# Patient Record
Sex: Male | Born: 1965 | Race: White | Hispanic: Yes | Marital: Married | Smoking: Never smoker
Health system: Southern US, Community
[De-identification: ages and names within clinical notes are randomized; demographics above are authoritative.]

## PROBLEM LIST (undated history)

## (undated) DIAGNOSIS — K2970 Gastritis, unspecified, without bleeding: Secondary | ICD-10-CM

## (undated) DIAGNOSIS — G4730 Sleep apnea, unspecified: Secondary | ICD-10-CM

## (undated) DIAGNOSIS — K219 Gastro-esophageal reflux disease without esophagitis: Secondary | ICD-10-CM

## (undated) HISTORY — DX: Gastro-esophageal reflux disease without esophagitis: K21.9

## (undated) HISTORY — DX: Gastritis, unspecified, without bleeding: K29.70

## (undated) HISTORY — DX: Sleep apnea, unspecified: G47.30

---

## 2014-07-24 DIAGNOSIS — R42 Dizziness and giddiness: Secondary | ICD-10-CM | POA: Insufficient documentation

## 2014-10-12 ENCOUNTER — Encounter (HOSPITAL_COMMUNITY): Payer: Self-pay | Admitting: Emergency Medicine

## 2014-10-12 ENCOUNTER — Emergency Department (HOSPITAL_COMMUNITY)
Admission: EM | Admit: 2014-10-12 | Discharge: 2014-10-12 | Disposition: A | Discharge status: Home / Self Care | Payer: BC Managed Care – PPO | Attending: Emergency Medicine | Admitting: Emergency Medicine

## 2014-10-12 ENCOUNTER — Emergency Department (HOSPITAL_COMMUNITY): Payer: BC Managed Care – PPO

## 2014-10-12 DIAGNOSIS — Z79899 Other long term (current) drug therapy: Secondary | ICD-10-CM | POA: Diagnosis not present

## 2014-10-12 DIAGNOSIS — R1084 Generalized abdominal pain: Secondary | ICD-10-CM | POA: Diagnosis present

## 2014-10-12 DIAGNOSIS — K5900 Constipation, unspecified: Secondary | ICD-10-CM | POA: Diagnosis not present

## 2014-10-12 MED ORDER — ONDANSETRON 4 MG PO TBDP
4.0000 mg | ORAL_TABLET | Freq: Once | ORAL | Status: AC
  Administered 2014-10-12: 4 mg via ORAL
  Filled 2014-10-12: qty 1

## 2014-10-12 MED ORDER — POLYETHYLENE GLYCOL 3350 17 GM/SCOOP PO POWD
17.0000 g | Freq: Two times a day (BID) | ORAL | Status: DC
Start: 2014-10-12 — End: 2015-02-07

## 2014-10-12 MED ORDER — MAGNESIUM CITRATE PO SOLN
1.0000 | Freq: Once | ORAL | Status: AC
  Administered 2014-10-12: 1 via ORAL
  Filled 2014-10-12: qty 296

## 2014-10-12 MED ORDER — GLYCERIN (LAXATIVE) 2 G RE SUPP: 1.0000 | Freq: Once | RECTAL | Status: DC

## 2014-10-12 MED ORDER — ONDANSETRON 4 MG PO TBDP: 4.0000 mg | ORAL_TABLET | Freq: Three times a day (TID) | ORAL | Status: DC | PRN

## 2014-10-12 MED ORDER — MILK AND MOLASSES ENEMA
1.0000 | Freq: Once | RECTAL | Status: AC
  Administered 2014-10-12: 250 mL via RECTAL
  Filled 2014-10-12: qty 250

## 2014-10-12 NOTE — ED Notes (Signed)
Pt is sipping on a ginger ale per Lorelle FormosaAmie, Charity fundraiserN.

## 2014-10-12 NOTE — Discharge Instructions (Signed)
Please read and follow all provided instructions.  Your diagnoses today include:  1. Constipation     Tests performed today include:  X-ray of your abdomen that shows a large amount of stool in your abdomen  Vital signs. See below for your results today.   Medications prescribed:   Miralax - laxative  This medication can be found over-the-counter.    Glycerin suppository - medication to help with bowel movement   Zofran (ondansetron) - for nausea and vomiting  Take any medications only as directed on prescription or on packaging.   Home care instructions:  Follow any educational materials contained in this packet.  Follow-up instructions: Please follow-up with your primary care provider in the next week for further evaluation of your symptoms.   Return instructions:   Please return to the Emergency Department if you experience worsening symptoms.   Please return if you have worsening abdominal pain, swelling of your abdomen, persistent vomiting, blood in your stool or vomit, or fever.   Please return if you have any other emergent concerns.  Additional Information:  Your vital signs today were: BP 119/55 mmHg   Pulse 74   Temp(Src) 97.9 F (36.6 C) (Oral)   Resp 16   SpO2 97% If your blood pressure (BP) was elevated above 135/85 this visit, please have this repeated by your doctor within one month. --------------

## 2014-10-12 NOTE — ED Notes (Signed)
Pt states he feels "a bit better" after milk and molasses enema.

## 2014-10-12 NOTE — ED Notes (Signed)
Pt is c/o constipation, bloating, and lots of gas  These sxs started around lunch  Pt has taken gas x and maalox without relief  Pt is c/o pain on his left side   Pt states he started having vomiting about 2 hrs ago

## 2014-10-12 NOTE — ED Provider Notes (Signed)
CSN: [id number]     Arrival date & time 10/12/14  0003 History   First MD Initiated Contact with Patient 10/12/14 0025     Chief Complaint  Patient presents with  . Abdominal Pain     (Consider location/radiation/quality/duration/timing/severity/associated sxs/prior Treatment) HPI Comments: Patient states he is constipated.  He hasn't had a good bowel movement in several days.  This afternoon after lunch.  She noticed abdominal distention and intermittent cramping , more in the left mid quadrant.  He states he has nauseated belching and passing flatus.  Denies any abdominal surgery, trauma.  He states he takes started taking an over-the-counter diet pill several days ago.  Denies any dysuria, frequency, fevers, recent illnesses, change in diet.  Patient is a 48 y.o. male presenting with abdominal pain. The history is provided by the patient.  Abdominal Pain Pain location:  Generalized Pain quality: cramping   Pain radiates to:  Does not radiate Pain severity:  Moderate Onset quality:  Gradual Duration:  10 hours Timing:  Intermittent Progression:  Worsening Chronicity:  New Context: diet changes   Context: not laxative use, not recent travel, not suspicious food intake and not trauma   Relieved by:  None tried Worsened by:  Nothing tried Ineffective treatments:  None tried Associated symptoms: flatus and nausea   Associated symptoms: no dysuria and no fever   Risk factors: obesity     History reviewed. No pertinent past medical history. History reviewed. No pertinent past surgical history. Family History  Problem Relation Age of Onset  . Hypertension Mother   . Hypertension Other   . Diabetes Other   . Cancer Other    History  Substance Use Topics  . Smoking status: Never Smoker   . Smokeless tobacco: Not on file  . Alcohol Use: Yes     Comment: social    Review of Systems  Constitutional: Negative for fever.  Gastrointestinal: Positive for nausea, abdominal pain  and flatus.  Genitourinary: Negative for dysuria and frequency.  Neurological: Negative for dizziness.  All other systems reviewed and are negative.     Allergies  Review of patient's allergies indicates no known allergies.  Home Medications   Prior to Admission medications   Medication Sig Start Date End Date Taking? Authorizing Provider  alum & mag hydroxide-simeth (MAALOX PLUS) 400-400-40 MG/5ML suspension Take 10 mLs by mouth every 6 (six) hours as needed for indigestion.   Yes Historical Provider, MD  Multiple Vitamin (MULTIVITAMIN WITH MINERALS) TABS tablet Take 1 tablet by mouth daily.   Yes Historical Provider, MD  OVER THE COUNTER MEDICATION Take 1 capsule by mouth daily.   Yes Historical Provider, MD  OVER THE COUNTER MEDICATION Take 1 tablet by mouth daily.   Yes Historical Provider, MD  simethicone (MYLICON) 80 MG chewable tablet Chew 160 mg by mouth every 6 (six) hours as needed for flatulence.   Yes Historical Provider, MD  glycerin adult (GLYCERIN ADULT) 2 G SUPP Place 1 suppository rectally once. 10/12/14   Renne CriglerJoshua Geiple, PA-C  ondansetron (ZOFRAN ODT) 4 MG disintegrating tablet Take 1 tablet (4 mg total) by mouth every 8 (eight) hours as needed for nausea or vomiting. 10/12/14   Renne CriglerJoshua Geiple, PA-C  polyethylene glycol powder (GLYCOLAX/MIRALAX) powder Take 17 g by mouth 2 (two) times daily. Use twice a day until you are having soft stools 10/12/14   Renne CriglerJoshua Geiple, PA-C   BP 119/70 mmHg  Pulse 78  Temp(Src) 98.2 F (36.8 C) (Oral)  Resp 16  SpO2 97% Physical Exam  ED Course  Procedures (including critical care time) Labs Review Labs Reviewed - No data to display  Imaging Review Dg Abd Acute W/chest  10/12/2014   CLINICAL DATA:  Constipation for 48 hr. Vomiting for a few hr. Left-sided abdominal pain radiating to the umbilical area.  EXAM: ACUTE ABDOMEN SERIES (ABDOMEN 2 VIEW & CHEST 1 VIEW)  COMPARISON:  None.  FINDINGS: Shallow inspiration with linear  atelectasis in the lung bases. Normal heart size and pulmonary vascularity. No focal airspace disease or consolidation in the lungs. No blunting of costophrenic angles. No pneumothorax. Mediastinal contours appear intact.  Stool-filled colon. No small or large bowel distention. No free intra-abdominal air. No abnormal air-fluid levels. No radiopaque stones. Visualized bones appear intact.  IMPRESSION: No evidence of active pulmonary disease. Atelectasis in the lung bases. Nonobstructive bowel gas pattern with stool-filled colon.   Electronically Signed   By: Burman NievesWilliam  Stevens M.D.   On: 10/12/2014 01:10     EKG Interpretation None      MDM   Final diagnoses:  Constipation         Arman FilterGail K Demitrious Mccannon, NP 10/12/14 2020  Olivia Mackielga M Otter, MD 10/13/14 1149

## 2014-10-12 NOTE — ED Provider Notes (Signed)
6:46 AM Handoff from Manus RuddSchulz NP at shift change. Patient with constipation for several days, pain and discomfort worse last night, developed vomiting as well. No fever. Patient states good hydration, exercise. He did start taking Lipozene for weight loss 3 days ago and thinks this may have caused his symptoms. Most recently received Milk of Molasses enema in ED with good results. States that discomfort is not gone, however it is improved from arrival. Currently undergoing PO trial. If he does well, will d/c to home with miralax and glycerin suppository. He has received mag citrate in ED as well.   Exam:  Gen NAD; Heart RRR, nml S1,S2, no m/r/g; Lungs CTAB; Abd soft, mild generalized tenderness, no rebound or guarding; Ext 2+ pedal pulses bilaterally, no edema.  7:20 AM Patient tolerated 1/2 can soda and walked the halls. He still complains of pain -- no longer in left side of abdomen but more central. Abdomen is typanitic. VSS. I asked patient and partner if he is comfortable with going home and he is. I encouraged treatments as prescribed. He can use OTC enema if desired.   I discussed return instructions with patient and partner. The patient was urged to return to the Emergency Department immediately with worsening of current symptoms, worsening abdominal pain, persistent vomiting, blood noted in stools, fever, or any other concerns. The patient verbalized understanding. We discussed that his symptoms are consistent with constipation at this time, but abdominal pain is unpredictable and can evolve over time.   BP 119/55 mmHg  Pulse 74  Temp(Src) 97.9 F (36.6 C) (Oral)  Resp 16  SpO2 97%        Renne CriglerJoshua Jewelianna Pancoast, PA-C 10/12/14 0725  Olivia Mackielga M Otter, MD 10/12/14 [id number]

## 2015-02-07 ENCOUNTER — Encounter (HOSPITAL_COMMUNITY): Payer: Self-pay | Admitting: Family Medicine

## 2015-02-07 ENCOUNTER — Emergency Department (HOSPITAL_COMMUNITY)
Admission: EM | Admit: 2015-02-07 | Discharge: 2015-02-07 | Disposition: A | Discharge status: Home / Self Care | Payer: BC Managed Care – PPO | Source: Home / Self Care | Attending: Family Medicine | Admitting: Family Medicine

## 2015-02-07 DIAGNOSIS — T148 Other injury of unspecified body region: Secondary | ICD-10-CM | POA: Diagnosis not present

## 2015-02-07 DIAGNOSIS — T148XXA Other injury of unspecified body region, initial encounter: Secondary | ICD-10-CM

## 2015-02-07 DIAGNOSIS — B958 Unspecified staphylococcus as the cause of diseases classified elsewhere: Secondary | ICD-10-CM

## 2015-02-07 MED ORDER — SULFAMETHOXAZOLE-TRIMETHOPRIM 800-160 MG PO TABS: 1.0000 | ORAL_TABLET | Freq: Two times a day (BID) | ORAL | Status: DC

## 2015-02-07 MED ORDER — SULFAMETHOXAZOLE-TRIMETHOPRIM 800-160 MG PO TABS
1.0000 | ORAL_TABLET | Freq: Two times a day (BID) | ORAL | Status: DC
Start: 2015-02-07 — End: 2015-02-07

## 2015-02-07 NOTE — ED Notes (Signed)
Infection to right forearm.  , onset second week in February.  Patient has had clindamycin/doxycycline/antibiotic ointment.

## 2015-02-07 NOTE — ED Provider Notes (Signed)
CSN: [id number]     Arrival date & time 02/07/15  1923 History   First MD Initiated Contact with Patient 02/07/15 1952     No chief complaint on file.  (Consider location/radiation/quality/duration/timing/severity/associated sxs/prior Treatment) HPI  R arm infection. Started 4 weeks ago. Started as a small red raised lesion. 2 different visits to UC in [place] and was given clindamycin on 2/28 and doxy on 3/5 and mupirocin ointment. W/o improvement. Ttp. Feeling tired but denies fevers, nausea, vomiting, CP, SOB, sycnope. Stopped ABX 4 days ago. In last day progression of skin irritation. cdntral purulence noted.    History reviewed. No pertinent past medical history. History reviewed. No pertinent past surgical history. Family History  Problem Relation Age of Onset  . Hypertension Mother   . Hypertension Other   . Diabetes Other   . Cancer Other    History  Substance Use Topics  . Smoking status: Never Smoker   . Smokeless tobacco: Not on file  . Alcohol Use: Yes     Comment: social    Review of Systems Per HPI with all other pertinent systems negative.   Allergies  Review of patient's allergies indicates no known allergies.  Home Medications   Prior to Admission medications   Medication Sig Start Date End Date Taking? Authorizing Provider  alum & mag hydroxide-simeth (MAALOX PLUS) 400-400-40 MG/5ML suspension Take 10 mLs by mouth every 6 (six) hours as needed for indigestion.    Historical Provider, MD  Multiple Vitamin (MULTIVITAMIN WITH MINERALS) TABS tablet Take 1 tablet by mouth daily.    Historical Provider, MD  OVER THE COUNTER MEDICATION Take 1 capsule by mouth daily.    Historical Provider, MD  OVER THE COUNTER MEDICATION Take 1 tablet by mouth daily.    Historical Provider, MD  simethicone (MYLICON) 80 MG chewable tablet Chew 160 mg by mouth every 6 (six) hours as needed for flatulence.    Historical Provider, MD  sulfamethoxazole-trimethoprim (BACTRIM DS,SEPTRA  DS) 800-160 MG per tablet Take 1 tablet by mouth 2 (two) times daily. 02/07/15   Ozella Rocksavid J Merrell, MD   BP 149/91 mmHg  Pulse 68  Temp(Src) 97.3 F (36.3 C) (Oral)  Resp 18  SpO2 95% Physical Exam Physical Exam  Constitutional: oriented to person, place, and time. appears well-developed and well-nourished. No distress.  HENT:  Head: Normocephalic and atraumatic.  Eyes: EOMI. PERRL.  Neck: Normal range of motion.  Cardiovascular: RRR, no m/r/g, 2+ distal pulses,  Pulmonary/Chest: Effort normal and breath sounds normal. No respiratory distress.  Abdominal: Soft. Bowel sounds are normal. NonTTP, no distension.  Musculoskeletal: Normal range of motion. Non ttp, no effusion.  Neurological: alert and oriented to person, place, and time.  Skin: minimal tenderness to palpation. Healthy granulation tissue apparent around area of central wound with ulceration. Surrounding area erythematous in the distribution of adhesive bandages which have been applied copiously over the last several days. No significant induration and no fluctuance. Psychiatric: normal mood and affect. behavior is normal. Judgment and thought content normal.       ED Course  Procedures (including critical care time) Labs Review Labs Reviewed - No data to display  Imaging Review No results found.   MDM   1. Staph infection   2. Open wound    Adhesive skin irritation with surrounding granulation tissue around central ulceration with poor wound healing. This is likely secondary to a significant staff and MRSA infection that is actually healing at this time. Expectations were put  in check and patient realizes that this may take several weeks in order for the wound to close and to fully start to heal. It is concerning that the patient has been on Doxy and Clinda and continues to have what is felt like purulent discharge and pain. There is no sign of crepitus or significant deep tissue infection today. Will change antibiotic  to Bactrim and send another wound culture. Patient's spouse is to call the previous urgent care and get the previous culture and sensitivities. Patient is to start doing wet-to-dry bandages changes every day and will follow up in our clinic with Dr. Clementeen Graham in one week. Patient is to go to the emergency room if develops any worsening symptoms.   Precautions given and all questions answered  [name] Flatten, MD Family Medicine 02/07/2015, 8:34 PM     Ozella Rocks, MD 02/07/15 2034

## 2015-02-07 NOTE — Discharge Instructions (Signed)
You had developed a open wound from a skin infection that will likely take several days to weeks to fully heal. The skin surrounding the wound is healthy appearing. Please start the Bactrim antibiotic. This will help with any staph or MRSA infection. Please continue wet to dry bandage changes, this means clean the wound daily with warm soapy water and allow clean water to rinse over the wound then pat the wound dry and apply a sterile bandage. Apply a small amount of sterile water over the wound region of the bandage allow this to dry over the course of the day and remove the bandage the following morning. Do this until there is no further tissue or discharge noted on the bandage. Please return as discussed next week to meet with Dr. Clementeen GrahamEvan Corey. Please call the previous urgent care and ask for the culture and sensitivities from the previous swab. Please go to the emergency room if her condition significantly worsens. Please use a daily probiotic to help with any abdominal discomfort or bacterial imbalance caused by the antibiotic.

## 2015-02-08 LAB — POCT PREGNANCY, URINE: PREG TEST UR: NEGATIVE

## 2015-02-10 LAB — WOUND CULTURE
Culture: NO GROWTH
Gram Stain: NONE SEEN

## 2015-02-13 ENCOUNTER — Telehealth (HOSPITAL_COMMUNITY): Payer: Self-pay | Admitting: Family Medicine

## 2015-02-13 NOTE — ED Notes (Signed)
Called and spoke to patient and spouse. Patient has been applying wet-to-dry bandages everyday. Today is the first day that there is no serous drainage on the bandage. Feels that the wound is significantly improved and nearly resolved. Continues to take Bactrim without complication. Of note they have not called the old urgent care yet to get culture and sensitivity reports. They will do so today. Advised patient to continue wet-to-dry bandages for 1 more day to ensure that wound is completely closed. Complete antibiotic course. They're to come in for further evaluation and management if wound has not completely closed and/or symptoms return. Discussed prolonged course of wound and scar healing due to significance of staph infection and ulceration of the skin.  [name] [name], MD Family Medicine 02/13/2015, 12:32 PM    Benjamin Rocksavid J Edelin Fryer, MD 02/13/15 [phone]

## 2015-06-14 ENCOUNTER — Encounter: Payer: Self-pay | Admitting: Family Medicine

## 2015-06-14 ENCOUNTER — Ambulatory Visit (INDEPENDENT_AMBULATORY_CARE_PROVIDER_SITE_OTHER): Payer: BC Managed Care – PPO | Admitting: Family Medicine

## 2015-06-14 VITALS — BP 133/83 | HR 64 | Ht 70.0 in | Wt 239.0 lb

## 2015-06-14 DIAGNOSIS — K2970 Gastritis, unspecified, without bleeding: Secondary | ICD-10-CM | POA: Diagnosis not present

## 2015-06-14 DIAGNOSIS — E669 Obesity, unspecified: Secondary | ICD-10-CM | POA: Diagnosis not present

## 2015-06-14 DIAGNOSIS — K219 Gastro-esophageal reflux disease without esophagitis: Secondary | ICD-10-CM | POA: Insufficient documentation

## 2015-06-14 DIAGNOSIS — R0683 Snoring: Secondary | ICD-10-CM | POA: Diagnosis not present

## 2015-06-14 HISTORY — DX: Gastro-esophageal reflux disease without esophagitis: K21.9

## 2015-06-14 HISTORY — DX: Gastritis, unspecified, without bleeding: K29.70

## 2015-06-14 LAB — COMPLETE METABOLIC PANEL WITH GFR
ALT: 36 U/L (ref 0–53)
AST: 24 U/L (ref 0–37)
Albumin: 4.5 g/dL (ref 3.5–5.2)
Alkaline Phosphatase: 68 U/L (ref 39–117)
BUN: 17 mg/dL (ref 6–23)
CHLORIDE: 100 meq/L (ref 96–112)
CO2: 29 mEq/L (ref 19–32)
Calcium: 9.4 mg/dL (ref 8.4–10.5)
Creat: 0.81 mg/dL (ref 0.50–1.35)
GFR, Est African American: >89 mL/min
GLUCOSE: 87 mg/dL (ref 70–99)
Potassium: 4.3 mEq/L (ref 3.5–5.3)
SODIUM: 141 meq/L (ref 135–145)
Total Bilirubin: 0.7 mg/dL (ref 0.2–1.2)
Total Protein: 7.2 g/dL (ref 6.0–8.3)

## 2015-06-14 LAB — CBC
HCT: 46.9 % (ref 39.0–52.0)
Hemoglobin: 15.5 g/dL (ref 13.0–17.0)
MCH: 29.2 pg (ref 26.0–34.0)
MCHC: 33 g/dL (ref 30.0–36.0)
MCV: 88.3 fL (ref 78.0–100.0)
MPV: 9.9 fL (ref 8.6–12.4)
Platelets: 251 10*3/uL (ref 150–400)
RBC: 5.31 MIL/uL (ref 4.22–5.81)
RDW: 14.1 % (ref 11.5–15.5)
WBC: 6.3 10*3/uL (ref 4.0–10.5)

## 2015-06-14 LAB — LDL CHOLESTEROL, DIRECT: Direct LDL: 116 mg/dL — ABNORMAL HIGH

## 2015-06-14 MED ORDER — OMEPRAZOLE 40 MG PO CPDR: 40.0000 mg | DELAYED_RELEASE_CAPSULE | Freq: Every day | ORAL | Status: DC

## 2015-06-14 MED ORDER — SUCRALFATE 1 G PO TABS: 1.0000 g | ORAL_TABLET | Freq: Three times a day (TID) | ORAL | Status: DC

## 2015-06-14 NOTE — Assessment & Plan Note (Signed)
Trial of omeprazole and Carafate if not better refer to gastroenterology

## 2015-06-14 NOTE — Patient Instructions (Addendum)
Thank you for coming in today. Go for sleep study.  Call me if you never hear from them.  Take omeprazole daily.  Use carafte for 1-2 weeks.  Gastroesophageal Reflux Disease, Adult Gastroesophageal reflux disease (GERD) happens when acid from your stomach flows up into the esophagus. When acid comes in contact with the esophagus, the acid causes soreness (inflammation) in the esophagus. Over time, GERD may create small holes (ulcers) in the lining of the esophagus. CAUSES   Increased body weight. This puts pressure on the stomach, making acid rise from the stomach into the esophagus.  Smoking. This increases acid production in the stomach.  Drinking alcohol. This causes decreased pressure in the lower esophageal sphincter (valve or ring of muscle between the esophagus and stomach), allowing acid from the stomach into the esophagus.  Late evening meals and a full stomach. This increases pressure and acid production in the stomach.  A malformed lower esophageal sphincter. Sometimes, no cause is found. SYMPTOMS   Burning pain in the lower part of the mid-chest behind the breastbone and in the mid-stomach area. This may occur twice a week or more often.  Trouble swallowing.  Sore throat.  Dry cough.  Asthma-like symptoms including chest tightness, shortness of breath, or wheezing. DIAGNOSIS  Your caregiver may be able to diagnose GERD based on your symptoms. In some cases, X-rays and other tests may be done to check for complications or to check the condition of your stomach and esophagus. TREATMENT  Your caregiver may recommend over-the-counter or prescription medicines to help decrease acid production. Ask your caregiver before starting or adding any new medicines.  HOME CARE INSTRUCTIONS   Change the factors that you can control. Ask your caregiver for guidance concerning weight loss, quitting smoking, and alcohol consumption.  Avoid foods and drinks that make your symptoms  worse, such as:  Caffeine or alcoholic drinks.  Chocolate.  Peppermint or mint flavorings.  Garlic and onions.  Spicy foods.  Citrus fruits, such as oranges, lemons, or limes.  Tomato-based foods such as sauce, chili, salsa, and pizza.  Fried and fatty foods.  Avoid lying down for the 3 hours prior to your bedtime or prior to taking a nap.  Eat small, frequent meals instead of large meals.  Wear loose-fitting clothing. Do not wear anything tight around your waist that causes pressure on your stomach.  Raise the head of your bed 6 to 8 inches with wood blocks to help you sleep. Extra pillows will not help.  Only take over-the-counter or prescription medicines for pain, discomfort, or fever as directed by your caregiver.  Do not take aspirin, ibuprofen, or other nonsteroidal anti-inflammatory drugs (NSAIDs). SEEK IMMEDIATE MEDICAL CARE IF:   You have pain in your arms, neck, jaw, teeth, or back.  Your pain increases or changes in intensity or duration.  You develop nausea, vomiting, or sweating (diaphoresis).  You develop shortness of breath, or you faint.  Your vomit is green, yellow, black, or looks like coffee grounds or blood.  Your stool is red, bloody, or black. These symptoms could be signs of other problems, such as heart disease, gastric bleeding, or esophageal bleeding. MAKE SURE YOU:   Understand these instructions.  Will watch your condition.  Will get help right away if you are not doing well or get worse. Document Released: 08/19/2005 Document Revised: 02/01/2012 Document Reviewed: 05/29/2011 [facility] Patient Information 2015 [place], [place]. This information is not intended to replace advice given to you by your health care  provider. Make sure you discuss any questions you have with your health care provider.

## 2015-06-14 NOTE — Assessment & Plan Note (Signed)
Sleep study

## 2015-06-14 NOTE — Progress Notes (Signed)
Benjamin Singleton is a 49 y.o. male who presents to [facility]  today for establish care. Patient notes left upper quadrant abdominal pain and stomach irritation. This is consistent with previous episodes of acute gastritis. He does not drink alcohol with any degree or frequency. He takes Excedrin migraine occasionally for headache. He's been using a lot of this recently for headache that has since resolved. He uses Zantac which helps some. He denies any fevers chills or vomiting or current diarrhea. In the past he has had a history of H. pylori that was successfully treated.  Additionally patient notes that he snores and wakes up gasping for breath frequently. His husband thinks that he has sleep apnea.   History reviewed. No pertinent past medical history. History reviewed. No pertinent past surgical history. History  Substance Use Topics  . Smoking status: Never Smoker   . Smokeless tobacco: Not on file  . Alcohol Use: Yes     Comment: social   ROS as above Medications: Current Outpatient Prescriptions  Medication Sig Dispense Refill  . alum & mag hydroxide-simeth (MAALOX PLUS) 400-400-40 MG/5ML suspension Take 10 mLs by mouth every 6 (six) hours as needed for indigestion.    . ASTRAGALUS PO Take by mouth.    . Cyanocobalamin (B-12 PO) Take by mouth.    Chilton Si Tea, Camillia sinensis, (GREEN TEA PO) Take by mouth.    . Loratadine (CLARITIN) 10 MG CAPS Take by mouth.    . Multiple Vitamin (MULTIVITAMIN WITH MINERALS) TABS tablet Take 1 tablet by mouth daily.    . Multiple Vitamins-Minerals (ZINC PO) Take by mouth.    . Omega-3 Fatty Acids (FISH OIL PO) Take by mouth.    Marland Kitchen OVER THE COUNTER MEDICATION Take 1 capsule by mouth daily.    Marland Kitchen OVER THE COUNTER MEDICATION Take 1 tablet by mouth daily.    . peppermint oil liquid by Does not apply route as needed.    . Pyridoxine HCl (B-6 PO) Take by mouth.    . ranitidine (ZANTAC) 150 MG capsule Take 150 mg by  mouth 2 (two) times daily.    . simethicone (MYLICON) 80 MG chewable tablet Chew 160 mg by mouth every 6 (six) hours as needed for flatulence.    Marland Kitchen Specialty Vitamins Products (MAGNESIUM, AMINO ACID CHELATE,) 133 MG tablet Take 1 tablet by mouth 2 (two) times daily.    Marland Kitchen omeprazole (PRILOSEC) 40 MG capsule Take 1 capsule (40 mg total) by mouth daily. 90 capsule 3  . sucralfate (CARAFATE) 1 G tablet Take 1 tablet (1 g total) by mouth 4 (four) times daily -  with meals and at bedtime. 60 tablet 0   No current facility-administered medications for this visit.   No Known Allergies   Exam:  BP 133/83 mmHg  Pulse 64  Ht  (1.778 m)  Wt 239 lb (108.41 kg)  BMI 34.29 kg/m2 Gen: Well NAD HEENT: EOMI,  MMM large neck Lungs: Normal work of breathing. CTABL Heart: RRR no MRG Abd: NABS, Soft. Nondistended, Nontender Exts: Brisk capillary refill, warm and well perfused.   No results found for this or any previous visit (from the past 24 hour(s)). No results found.   Please see individual assessment and plan sections.

## 2015-06-16 NOTE — Progress Notes (Signed)
Quick Note:  Normal, no changes. ______ 

## 2015-09-10 ENCOUNTER — Ambulatory Visit (HOSPITAL_BASED_OUTPATIENT_CLINIC_OR_DEPARTMENT_OTHER): Payer: BC Managed Care – PPO | Attending: Family Medicine

## 2015-12-09 ENCOUNTER — Encounter (HOSPITAL_COMMUNITY): Payer: Self-pay | Admitting: Emergency Medicine

## 2015-12-09 ENCOUNTER — Emergency Department (HOSPITAL_COMMUNITY)
Admission: EM | Admit: 2015-12-09 | Discharge: 2015-12-09 | Disposition: A | Discharge status: Home / Self Care | Payer: BC Managed Care – PPO | Attending: Emergency Medicine | Admitting: Emergency Medicine

## 2015-12-09 ENCOUNTER — Telehealth: Payer: Self-pay | Admitting: *Deleted

## 2015-12-09 DIAGNOSIS — K5900 Constipation, unspecified: Secondary | ICD-10-CM | POA: Insufficient documentation

## 2015-12-09 DIAGNOSIS — R1013 Epigastric pain: Secondary | ICD-10-CM

## 2015-12-09 DIAGNOSIS — Z79899 Other long term (current) drug therapy: Secondary | ICD-10-CM | POA: Insufficient documentation

## 2015-12-09 DIAGNOSIS — R110 Nausea: Secondary | ICD-10-CM | POA: Diagnosis not present

## 2015-12-09 DIAGNOSIS — R109 Unspecified abdominal pain: Secondary | ICD-10-CM | POA: Diagnosis present

## 2015-12-09 LAB — COMPREHENSIVE METABOLIC PANEL
ALBUMIN: 4.1 g/dL (ref 3.5–5.0)
ALK PHOS: 62 U/L (ref 38–126)
ALT: 36 U/L (ref 17–63)
ANION GAP: 8 (ref 5–15)
AST: 28 U/L (ref 15–41)
BUN: 10 mg/dL (ref 6–20)
CO2: 28 mmol/L (ref 22–32)
Calcium: 9 mg/dL (ref 8.9–10.3)
Chloride: 101 mmol/L (ref 101–111)
Creatinine, Ser: 0.94 mg/dL (ref 0.61–1.24)
GFR calc Af Amer: >60 mL/min (ref >60)
GFR calc non Af Amer: >60 mL/min (ref >60)
Glucose, Bld: 118 mg/dL — ABNORMAL HIGH (ref 65–99)
Potassium: 4.3 mmol/L (ref 3.5–5.1)
SODIUM: 137 mmol/L (ref 135–145)
Total Bilirubin: 0.6 mg/dL (ref 0.3–1.2)
Total Protein: 7 g/dL (ref 6.5–8.1)

## 2015-12-09 LAB — CBC
HEMATOCRIT: 45.1 % (ref 39.0–52.0)
HEMOGLOBIN: 15 g/dL (ref 13.0–17.0)
MCH: 29.6 pg (ref 26.0–34.0)
MCHC: 33.3 g/dL (ref 30.0–36.0)
MCV: 89 fL (ref 78.0–100.0)
Platelets: 233 10*3/uL (ref 150–400)
RBC: 5.07 MIL/uL (ref 4.22–5.81)
RDW: 13.4 % (ref 11.5–15.5)
WBC: 11.6 10*3/uL — ABNORMAL HIGH (ref 4.0–10.5)

## 2015-12-09 LAB — LIPASE, BLOOD: Lipase: 45 U/L (ref 11–51)

## 2015-12-09 LAB — I-STAT TROPONIN, ED: TROPONIN I, POC: 0 ng/mL (ref 0.00–0.08)

## 2015-12-09 LAB — URINALYSIS, ROUTINE W REFLEX MICROSCOPIC
BILIRUBIN URINE: NEGATIVE
GLUCOSE, UA: NEGATIVE mg/dL
Hgb urine dipstick: NEGATIVE
Ketones, ur: NEGATIVE mg/dL
Leukocytes, UA: NEGATIVE
Nitrite: NEGATIVE
PH: 7.5 (ref 5.0–8.0)
Protein, ur: NEGATIVE mg/dL
SPECIFIC GRAVITY, URINE: 1.019 (ref 1.005–1.030)

## 2015-12-09 MED ORDER — MILK AND MOLASSES ENEMA
1.0000 | Freq: Once | RECTAL | Status: AC
  Administered 2015-12-09: 250 mL via RECTAL
  Filled 2015-12-09: qty 250

## 2015-12-09 MED ORDER — ONDANSETRON 4 MG PO TBDP
4.0000 mg | ORAL_TABLET | Freq: Once | ORAL | Status: AC
  Administered 2015-12-09: 4 mg via ORAL
  Filled 2015-12-09: qty 1

## 2015-12-09 MED ORDER — GI COCKTAIL ~~LOC~~
30.0000 mL | Freq: Once | ORAL | Status: AC
  Administered 2015-12-09: 30 mL via ORAL
  Filled 2015-12-09: qty 30

## 2015-12-09 MED ORDER — ALUM & MAG HYDROXIDE-SIMETH 200-200-20 MG/5ML PO SUSP: 30.0000 mL | Freq: Once | ORAL | Status: DC

## 2015-12-09 MED ORDER — DOCUSATE SODIUM 100 MG PO CAPS: 100.0000 mg | ORAL_CAPSULE | Freq: Two times a day (BID) | ORAL | Status: DC

## 2015-12-09 MED ORDER — OMEPRAZOLE 40 MG PO CPDR: 40.0000 mg | DELAYED_RELEASE_CAPSULE | Freq: Every day | ORAL | Status: DC

## 2015-12-09 MED ORDER — PANTOPRAZOLE SODIUM 40 MG PO TBEC
40.0000 mg | DELAYED_RELEASE_TABLET | Freq: Every day | ORAL | Status: DC
  Administered 2015-12-09: 40 mg via ORAL
  Filled 2015-12-09: qty 1

## 2015-12-09 MED ORDER — SUCRALFATE 1 G PO TABS: 1.0000 g | ORAL_TABLET | Freq: Three times a day (TID) | ORAL | Status: DC

## 2015-12-09 NOTE — ED Notes (Signed)
flexi seal removed-- pt assisted to bedside commode.

## 2015-12-09 NOTE — ED Notes (Signed)
Pt had moderate success from enema, continues to have pain 6/10 epigastric area.

## 2015-12-09 NOTE — ED Notes (Signed)
Pt vomiting in the lobby, given emesis bag, alcohol pads for nose.

## 2015-12-09 NOTE — ED Notes (Signed)
C/o sharp mid upper abd pain x 24 hours with nausea.  Denies vomiting.  States he is constipated.  Last BM 2 hours ago but only a small amount.  Took antiacids, glycerin suppositories, and multiple OTC meds without relief.

## 2015-12-09 NOTE — Telephone Encounter (Signed)
The patient's husband called and stated that the patient is still bloated and in pain.  He was seen in the ED last night. They gave GI cocktail and enema with some relief. Patient continues to have pain.  He has an appointment tomorrow morning with Dr. Denyse Amassorey.  Advised the husband to keep fluids on board and to continue with the maalox, carafate and simethicone, as well as some gatorade.  If the pain gets more intense or changes location, they will head back to the ED.

## 2015-12-09 NOTE — ED Provider Notes (Signed)
CSN: [id number]     Arrival date & time 12/09/15  0256 History   First MD Initiated Contact with Patient 12/09/15 [phone]     Chief Complaint  Patient presents with  . Abdominal Pain     (Consider location/radiation/quality/duration/timing/severity/associated sxs/prior Treatment) HPI Comments: 50yo M w/ h/o PUD and gastritis who p/w abdominal pain. The patient states that for the past 24 hours he has had constant upper abdominal pain associated with nausea and constipation. He has taken multiple over-the-counter medications including antacids, MiraLAX, Mylanta, and glycerin suppositories with only minimal relief of his constipation but no relief of his abdominal pain. This evening in the lobby he began vomiting. He denies any fevers. No significant NSAID or alcohol use. He has a distant history of peptic ulcers and was on a PPI in the past but is not currently.  Patient is a 50 y.o. male presenting with abdominal pain. The history is provided by the patient.  Abdominal Pain   History reviewed. No pertinent past medical history. History reviewed. No pertinent past surgical history. Family History  Problem Relation Age of Onset  . Hypertension Mother   . Hypertension Other   . Diabetes Other   . Cancer Other    Social History  Substance Use Topics  . Smoking status: Never Smoker   . Smokeless tobacco: None  . Alcohol Use: Yes     Comment: social    Review of Systems  Gastrointestinal: Positive for abdominal pain.   10 Systems reviewed and are negative for acute change except as noted in the HPI.    Allergies  Review of patient's allergies indicates no known allergies.  Home Medications   Prior to Admission medications   Medication Sig Start Date End Date Taking? Authorizing Provider  alum & mag hydroxide-simeth (MAALOX PLUS) 400-400-40 MG/5ML suspension Take 10 mLs by mouth every 6 (six) hours as needed for indigestion.    Historical Provider, MD  ASTRAGALUS PO Take by  mouth.    Historical Provider, MD  Cyanocobalamin (B-12 PO) Take by mouth.    Historical Provider, MD  docusate sodium (COLACE) 100 MG capsule Take 1 capsule (100 mg total) by mouth every 12 (twelve) hours. 12/09/15   Laurence Spates, MD  [place] Si Tea, Camillia sinensis, (GREEN TEA PO) Take by mouth.    Historical Provider, MD  Loratadine (CLARITIN) 10 MG CAPS Take by mouth.    Historical Provider, MD  Multiple Vitamin (MULTIVITAMIN WITH MINERALS) TABS tablet Take 1 tablet by mouth daily.    Historical Provider, MD  Multiple Vitamins-Minerals (ZINC PO) Take by mouth.    Historical Provider, MD  Omega-3 Fatty Acids (FISH OIL PO) Take by mouth.    Historical Provider, MD  omeprazole (PRILOSEC) 40 MG capsule Take 1 capsule (40 mg total) by mouth daily. 12/09/15   Laurence Spates, MD  OVER THE COUNTER MEDICATION Take 1 capsule by mouth daily.    Historical Provider, MD  OVER THE COUNTER MEDICATION Take 1 tablet by mouth daily.    Historical Provider, MD  peppermint oil liquid by Does not apply route as needed.    Historical Provider, MD  Pyridoxine HCl (B-6 PO) Take by mouth.    Historical Provider, MD  ranitidine (ZANTAC) 150 MG capsule Take 150 mg by mouth 2 (two) times daily.    Historical Provider, MD  simethicone (MYLICON) 80 MG chewable tablet Chew 160 mg by mouth every 6 (six) hours as needed for flatulence.    Historical Provider, MD  Specialty Vitamins Products (MAGNESIUM, AMINO ACID CHELATE,) 133 MG tablet Take 1 tablet by mouth 2 (two) times daily.    Historical Provider, MD  sucralfate (CARAFATE) 1 g tablet Take 1 tablet (1 g total) by mouth 4 (four) times daily -  with meals and at bedtime. 12/09/15   Ambrose Finlandachel Morgan Little, MD   BP 123/78 mmHg  Pulse 73  Temp(Src) 98.3 F (36.8 C) (Oral)  Resp 15  Ht 5\' 10"  (1.778 m)  Wt 229 lb (103.874 kg)  BMI 32.86 kg/m2  SpO2 95% Physical Exam  Constitutional: He is oriented to person, place, and time. He appears well-developed and  well-nourished. No distress.  Uncomfortable  HENT:  Head: Normocephalic and atraumatic.  Moist mucous membranes  Eyes: Conjunctivae are normal. Pupils are equal, round, and reactive to light.  Neck: Neck supple.  Cardiovascular: Normal rate, regular rhythm and normal heart sounds.   No murmur heard. Pulmonary/Chest: Effort normal and breath sounds normal.  Abdominal: Soft. Bowel sounds are normal. He exhibits no distension. There is no rebound and no guarding.  midepigastric TTP  Musculoskeletal: He exhibits no edema.  Neurological: He is alert and oriented to person, place, and time.  Fluent speech  Skin: Skin is warm and dry.  Psychiatric: He has a normal mood and affect. Judgment normal.  Nursing note and vitals reviewed.   ED Course  Procedures (including critical care time) Labs Review Labs Reviewed  COMPREHENSIVE METABOLIC PANEL - Abnormal; Notable for the following:    Glucose, Bld 118 (*)    All other components within normal limits  CBC - Abnormal; Notable for the following:    WBC 11.6 (*)    All other components within normal limits  URINALYSIS, ROUTINE W REFLEX MICROSCOPIC (NOT AT [facility]) - Abnormal; Notable for the following:    APPearance CLOUDY (*)    All other components within normal limits  LIPASE, BLOOD  I-STAT TROPOININ, ED    Imaging Review No results found. I have personally reviewed and evaluated these lab results as part of my medical decision-making.   EKG Interpretation   Date/Time:  Monday December 09 2015 03:01:32 EST Ventricular Rate:  63 PR Interval:  180 QRS Duration: 150 QT Interval:  448 QTC Calculation: 458 R Axis:   72 Text Interpretation:  Normal sinus rhythm Right bundle branch block  Abnormal ECG No previous ECGs available Confirmed by LITTLE MD, RACHEL  [phone]) on 12/09/2015 4:18:53 AM     Medications  pantoprazole (PROTONIX) EC tablet 40 mg (40 mg Oral Given 12/09/15 0801)  gi cocktail (Maalox,Lidocaine,Donnatal) (30 mLs  Oral Given 12/09/15 [id number])  milk and molasses enema (250 mLs Rectal Given 12/09/15 0709)  ondansetron (ZOFRAN-ODT) disintegrating tablet 4 mg (4 mg Oral Given 12/09/15 0656)    MDM   Final diagnoses:  Epigastric pain  Constipation, unspecified constipation type   Patient presents with 1 day of midepigastric abdominal pain associated with constipation and one episode of vomiting this evening. On exam he was uncomfortable but nontoxic. Vital signs stable. Midepigastric tenderness noted. Gave the patient protonix, GI cocktail, and enema.  Obtained above lab work which was unremarkable and showed normal lipase and LFTs. Pt later vomited, gave zofran.  On reexamination, pt was resting comfortably and tolerating liquids. He stated that overall his constipation and abdominal pain were improved. Given that he has no lower abdominal tenderness and his lab work is reassuring, I feel he is safe for discharge home. Discussed supportive care treatment for constipation and  discussed empiric treatment for gastritis or PUD with omeprazole and Carafate. Emphasized importance of follow-up with PCP for gastroenterology referral. Patient and spouse voiced understanding and patient was discharged in satisfactory condition.    Laurence Spates, MD 12/09/15 [phone]

## 2015-12-10 ENCOUNTER — Ambulatory Visit (INDEPENDENT_AMBULATORY_CARE_PROVIDER_SITE_OTHER): Payer: BC Managed Care – PPO | Admitting: Family Medicine

## 2015-12-10 ENCOUNTER — Encounter (HOSPITAL_BASED_OUTPATIENT_CLINIC_OR_DEPARTMENT_OTHER): Payer: Self-pay

## 2015-12-10 ENCOUNTER — Encounter: Payer: Self-pay | Admitting: Family Medicine

## 2015-12-10 ENCOUNTER — Ambulatory Visit (HOSPITAL_BASED_OUTPATIENT_CLINIC_OR_DEPARTMENT_OTHER)
Admission: RE | Admit: 2015-12-10 | Discharge: 2015-12-10 | Disposition: A | Discharge status: Home / Self Care | Payer: BC Managed Care – PPO | Source: Ambulatory Visit | Attending: Family Medicine | Admitting: Family Medicine

## 2015-12-10 VITALS — BP 124/70 | HR 80 | Wt 243.0 lb

## 2015-12-10 DIAGNOSIS — K2970 Gastritis, unspecified, without bleeding: Secondary | ICD-10-CM

## 2015-12-10 DIAGNOSIS — R109 Unspecified abdominal pain: Secondary | ICD-10-CM | POA: Insufficient documentation

## 2015-12-10 DIAGNOSIS — R140 Abdominal distension (gaseous): Secondary | ICD-10-CM | POA: Diagnosis not present

## 2015-12-10 DIAGNOSIS — K5900 Constipation, unspecified: Secondary | ICD-10-CM | POA: Insufficient documentation

## 2015-12-10 DIAGNOSIS — R1084 Generalized abdominal pain: Secondary | ICD-10-CM | POA: Diagnosis not present

## 2015-12-10 LAB — CBC
HCT: 41.5 % (ref 39.0–52.0)
HEMOGLOBIN: 14.7 g/dL (ref 13.0–17.0)
MCH: 29.6 pg (ref 26.0–34.0)
MCHC: 35.4 g/dL (ref 30.0–36.0)
MCV: 83.5 fL (ref 78.0–100.0)
MPV: 9.9 fL (ref 8.6–12.4)
PLATELETS: 240 10*3/uL (ref 150–400)
RBC: 4.97 MIL/uL (ref 4.22–5.81)
RDW: 13.7 % (ref 11.5–15.5)
WBC: 13.3 10*3/uL — ABNORMAL HIGH (ref 4.0–10.5)

## 2015-12-10 LAB — COMPREHENSIVE METABOLIC PANEL
ALBUMIN: 4.2 g/dL (ref 3.6–5.1)
ALK PHOS: 74 U/L (ref 40–115)
ALT: 30 U/L (ref 9–46)
AST: 22 U/L (ref 10–35)
BILIRUBIN TOTAL: 1.3 mg/dL — AB (ref 0.2–1.2)
BUN: 10 mg/dL (ref 7–25)
CO2: 25 mmol/L (ref 20–31)
CREATININE: 0.83 mg/dL (ref 0.70–1.33)
Calcium: 8.6 mg/dL (ref 8.6–10.3)
Chloride: 97 mmol/L — ABNORMAL LOW (ref 98–110)
Glucose, Bld: 95 mg/dL (ref 65–99)
Potassium: 4 mmol/L (ref 3.5–5.3)
SODIUM: 135 mmol/L (ref 135–146)
TOTAL PROTEIN: 6.5 g/dL (ref 6.1–8.1)

## 2015-12-10 LAB — LIPASE: LIPASE: 47 U/L (ref 7–60)

## 2015-12-10 MED ORDER — POLYETHYLENE GLYCOL 3350 17 GM/SCOOP PO POWD: 17.0000 g | Freq: Every day | ORAL | Status: DC

## 2015-12-10 MED ORDER — ONDANSETRON 4 MG PO TBDP
8.0000 mg | ORAL_TABLET | Freq: Once | ORAL | Status: AC
  Administered 2015-12-10: 8 mg via ORAL

## 2015-12-10 MED ORDER — ONDANSETRON HCL 8 MG PO TABS: 8.0000 mg | ORAL_TABLET | Freq: Three times a day (TID) | ORAL | Status: DC | PRN

## 2015-12-10 MED ORDER — IOHEXOL 300 MG/ML  SOLN
100.0000 mL | Freq: Once | INTRAMUSCULAR | Status: AC | PRN
  Administered 2015-12-10: 100 mL via INTRAVENOUS

## 2015-12-10 MED ORDER — OMEPRAZOLE 40 MG PO CPDR: 40.0000 mg | DELAYED_RELEASE_CAPSULE | Freq: Every day | ORAL | Status: DC

## 2015-12-10 NOTE — Patient Instructions (Addendum)
Thank you for coming in today. You were seen today for your belly pain.  We need to do imaging and bloodwork because we are worried that your GI system might be blocked.  We recommend that you take your omeprazole (Prilosec) every day and Sucralfate as prescribed.  We recommend daily Benefiber and Miralax.  Please take the Zofran as needed for the Please come back on Thursday.   Food Choices for Gastroesophageal Reflux Disease, Adult When you have gastroesophageal reflux disease (GERD), the foods you eat and your eating habits are very important. Choosing the right foods can help ease the discomfort of GERD. WHAT GENERAL GUIDELINES DO I NEED TO FOLLOW?  Choose fruits, vegetables, whole grains, low-fat dairy products, and low-fat meat, fish, and poultry.  Limit fats such as oils, salad dressings, butter, nuts, and avocado.  Keep a food diary to identify foods that cause symptoms.  Avoid foods that cause reflux. These may be different for different people.  Eat frequent small meals instead of three large meals each day.  Eat your meals slowly, in a relaxed setting.  Limit fried foods.  Cook foods using methods other than frying.  Avoid drinking alcohol.  Avoid drinking large amounts of liquids with your meals.  Avoid bending over or lying down until 2-3 hours after eating. WHAT FOODS ARE NOT RECOMMENDED? The following are some foods and drinks that may worsen your symptoms: Vegetables Tomatoes. Tomato juice. Tomato and spaghetti sauce. Chili peppers. Onion and garlic. Horseradish. Fruits Oranges, grapefruit, and lemon (fruit and juice). Meats High-fat meats, fish, and poultry. This includes hot dogs, ribs, ham, sausage, salami, and bacon. Dairy Whole milk and chocolate milk. Sour cream. Cream. Butter. Ice cream. Cream cheese.  Beverages Coffee and tea, with or without caffeine. Carbonated beverages or energy drinks. Condiments Hot sauce. Barbecue sauce.   Sweets/Desserts Chocolate and cocoa. Donuts. Peppermint and spearmint. Fats and Oils High-fat foods, including [place] fries and potato chips. Other Vinegar. Strong spices, such as black pepper, white pepper, red pepper, cayenne, curry powder, cloves, ginger, and chili powder. The items listed above may not be a complete list of foods and beverages to avoid. Contact your dietitian for more information.   This information is not intended to replace advice given to you by your health care provider. Make sure you discuss any questions you have with your health care provider.   Document Released: 11/09/2005 Document Revised: 11/30/2014 Document Reviewed: 09/13/2013 Elsevier Interactive Patient Education [employer]

## 2015-12-10 NOTE — Assessment & Plan Note (Addendum)
Acute, worsening. Given the intermittent and now worsening nature of the pain, as well as how uncomfortable he is this morning, concerns for obstruction of some etiology. Acutely, imaging and repeat labs is warranted Going forward, I think the patient would best benefit from an endoscopy and colonoscopy given long history of gastritis and constipation as well as current health maintenance needs. Will prescribe omeprazole, Miralax and daily fiber. Follow up in 2 days.   Referral to GI. Stat CT scan abdomen and pelvis ordered. Stat CBC CMP and lipase ordered.

## 2015-12-10 NOTE — Progress Notes (Signed)
Quick Note:  CT scan shows inflammation of the first part of the small intestine and near the pancreas. This is likely due to stomach acid problems.   1) Take the omeprazole twice daily. This is an increase from once daily.  2) Stop All supplements that we don't prescribe.  3) Take the carafate 4x daily.  4) Eat a clear liquid diet (broths etc) until we follow up on Thursday.  5) We are awaiting on labs to come back. ______

## 2015-12-10 NOTE — Progress Notes (Signed)
Yardley Lekas is a 50 y.o. male who presents to [facility]: Primary Care today for abdominal pain and constipation.   Patient has a 3 day history of intermittent abdominal pain. Patient says this has gradually worsened, worst at 7-9/10 yesterday but today feeling like 4-5/10. Patient notes this started epigastic but has since become diffuse. Patient notes he is very uncomfortable and bloated, and he feels like he can't get into a comfortable position. Patient was seen in the ER yesterday with negative lipase, CMP and CBC and negative EKG. He vomited twice and had 1 BM after enema, though has not had another since. Patient was also given GI cocktail which made him feel better late yesterday, prompting discharge from ED. Carafate has made him feel better as well. Today he is starting to feel worse. He has not passed gas nor had a BM this AM but was able to eat breakfast. He has not had any more emesis since yesterday.   Patient has a history of GERD, treated for H pylori infection many years ago. He does not take regular medication for this, and his symptoms have been well controlled since he was last seen 8 months ago. He notes he will occasionally feel epigastric pain but will drink chamomile tea, peppermint oil or chocolate drinks to help settle it.    No past medical history on file. No past surgical history on file. Social History  Substance Use Topics  . Smoking status: Never Smoker   . Smokeless tobacco: Not on file  . Alcohol Use: Yes     Comment: social   family history includes Cancer in his other; Diabetes in his other; Hypertension in his mother and other.  ROS as above Medications: Current Outpatient Prescriptions  Medication Sig Dispense Refill  . alum & mag hydroxide-simeth (MAALOX PLUS) 400-400-40 MG/5ML suspension Take 10 mLs by mouth every 6 (six) hours as needed for indigestion.    .  Loratadine (CLARITIN) 10 MG CAPS Take by mouth.    . Multiple Vitamin (MULTIVITAMIN WITH MINERALS) TABS tablet Take 1 tablet by mouth daily.    . Omega-3 Fatty Acids (FISH OIL PO) Take by mouth.    Marland Kitchen omeprazole (PRILOSEC) 40 MG capsule Take 1 capsule (40 mg total) by mouth daily. 30 capsule 3  . simethicone (MYLICON) 80 MG chewable tablet Chew 160 mg by mouth every 6 (six) hours as needed for flatulence.    . sucralfate (CARAFATE) 1 g tablet Take 1 tablet (1 g total) by mouth 4 (four) times daily -  with meals and at bedtime. 56 tablet 0  . ondansetron (ZOFRAN) 8 MG tablet Take 1 tablet (8 mg total) by mouth every 8 (eight) hours as needed for nausea or vomiting. 20 tablet 1  . polyethylene glycol powder (GLYCOLAX/MIRALAX) powder Take 17 g by mouth daily. 850 g 1   No current facility-administered medications for this visit.   No Known Allergies   Exam:  BP 124/70 mmHg  Pulse 80  Wt 243 lb (110.224 kg) Gen: Uncomfortable appearing gentleman fidgeting on exam table  HEENT: EOMI,  MMM Lungs: Normal work of breathing. CTABL no wheezes or crackles  Heart: RRR no MRG Abd: Diminished bowel sounds, diffusely tender to palpation, no rebound or guarding Exts: Brisk capillary refill, warm and well perfused.  Skin: diaphoretic   Patient was given 8 mg of Zofran ODT prior to discharge  No results found for this or any previous visit (from  the past 24 hour(s)). No results found.   Please see individual assessment and plan sections.

## 2015-12-12 ENCOUNTER — Encounter: Payer: Self-pay | Admitting: Family Medicine

## 2015-12-12 ENCOUNTER — Ambulatory Visit (INDEPENDENT_AMBULATORY_CARE_PROVIDER_SITE_OTHER): Payer: BC Managed Care – PPO | Admitting: Family Medicine

## 2015-12-12 VITALS — BP 136/79 | HR 72 | Wt 240.0 lb

## 2015-12-12 DIAGNOSIS — K2970 Gastritis, unspecified, without bleeding: Secondary | ICD-10-CM

## 2015-12-12 DIAGNOSIS — R1084 Generalized abdominal pain: Secondary | ICD-10-CM | POA: Diagnosis not present

## 2015-12-12 LAB — COMPREHENSIVE METABOLIC PANEL
ALK PHOS: 119 U/L — AB (ref 40–115)
ALT: 42 U/L (ref 9–46)
AST: 26 U/L (ref 10–35)
Albumin: 3.9 g/dL (ref 3.6–5.1)
BUN: 11 mg/dL (ref 7–25)
CO2: 28 mmol/L (ref 20–31)
CREATININE: 0.86 mg/dL (ref 0.70–1.33)
Calcium: 9.1 mg/dL (ref 8.6–10.3)
Chloride: 97 mmol/L — ABNORMAL LOW (ref 98–110)
GLUCOSE: 81 mg/dL (ref 65–99)
Potassium: 3.9 mmol/L (ref 3.5–5.3)
SODIUM: 133 mmol/L — AB (ref 135–146)
TOTAL PROTEIN: 6.7 g/dL (ref 6.1–8.1)
Total Bilirubin: 1.3 mg/dL — ABNORMAL HIGH (ref 0.2–1.2)

## 2015-12-12 LAB — LIPASE: LIPASE: 33 U/L (ref 7–60)

## 2015-12-12 LAB — CBC
HEMATOCRIT: 42.7 % (ref 39.0–52.0)
HEMOGLOBIN: 14.3 g/dL (ref 13.0–17.0)
MCH: 29.5 pg (ref 26.0–34.0)
MCHC: 33.5 g/dL (ref 30.0–36.0)
MCV: 88.2 fL (ref 78.0–100.0)
MPV: 10.2 fL (ref 8.6–12.4)
PLATELETS: 282 10*3/uL (ref 150–400)
RBC: 4.84 MIL/uL (ref 4.22–5.81)
RDW: 13.3 % (ref 11.5–15.5)
WBC: 11.1 10*3/uL — AB (ref 4.0–10.5)

## 2015-12-12 NOTE — Assessment & Plan Note (Addendum)
Likely cause of his abdominal pain given imaging and reassuring labs. Because pain is still present and he had slightly elevated bilirubin and WBC 2 days ago, will re check today to ensure return to normal. Readjusted laxatives. Encouraged patient to schedule an appointment with GI for likely endoscopy and colonoscopy. Continue omeprazole BID for 1 week then stop, add a second dose for pain as needed. Stop sucralfate in 1 week. Follow up in 2 weeks or sooner as needed.

## 2015-12-12 NOTE — Progress Notes (Signed)
Benjamin Singleton is a 50 y.o. male who presents to [facility]: Primary Care today for Follow up abdominal pain  Patient is feeling better today. He still has residual mild LUQ pain but this has progressively improved since 2 days ago. His energy is returning and appetite is improving. He has been on clear liquids and taking his medications as prescribed. He had 6-7 loose stools yesterday and 2-3 more this morning and would like to decrease his laxatives. He has yet to schedule a GI appt because he wanted to wait until he was feeling better. He is able to move around much better at home. He denies melena, vomiting, fever.    No past medical history on file. No past surgical history on file. Social History  Substance Use Topics  . Smoking status: Never Smoker   . Smokeless tobacco: Not on file  . Alcohol Use: Yes     Comment: social   family history includes Cancer in his other; Diabetes in his other; Hypertension in his mother and other.  ROS as above Medications: Current Outpatient Prescriptions  Medication Sig Dispense Refill  . alum & mag hydroxide-simeth (MAALOX PLUS) 400-400-40 MG/5ML suspension Take 10 mLs by mouth every 6 (six) hours as needed for indigestion.    . Loratadine (CLARITIN) 10 MG CAPS Take by mouth.    . Multiple Vitamin (MULTIVITAMIN WITH MINERALS) TABS tablet Take 1 tablet by mouth daily.    . Omega-3 Fatty Acids (FISH OIL PO) Take by mouth.    Marland Kitchen omeprazole (PRILOSEC) 40 MG capsule Take 1 capsule (40 mg total) by mouth daily. 30 capsule 3  . ondansetron (ZOFRAN) 8 MG tablet Take 1 tablet (8 mg total) by mouth every 8 (eight) hours as needed for nausea or vomiting. 20 tablet 1  . polyethylene glycol powder (GLYCOLAX/MIRALAX) powder Take 17 g by mouth daily. 850 g 1  . simethicone (MYLICON) 80 MG chewable tablet Chew 160 mg by mouth every 6 (six) hours as needed for flatulence.      . sucralfate (CARAFATE) 1 g tablet Take 1 tablet (1 g total) by mouth 4 (four) times daily -  with meals and at bedtime. 56 tablet 0   No current facility-administered medications for this visit.   No Known Allergies   Exam:  BP 136/79 mmHg  Pulse 72  Wt 240 lb (108.863 kg) Gen: Mildly uncomfortable appearing gentleman in NAD HEENT: EOMI,  MMM Lungs: Normal work of breathing. CTABL no wheezes or crackles  Heart: RRR no MRG Abd: Soft. Nondistended, Active bowel sounds. Mildly tender in the RUQ and LUQ. No rebound or guarding Exts: Brisk capillary refill, warm and well perfused.  Skin: mild diaphoresis on back and forehead, wearing sweatshirt, knit cap and sweatpants  No results found for this or any previous visit (from the past 24 hour(s)). Ct Abdomen Pelvis W Contrast  12/10/2015  CLINICAL DATA:  Abdominal pain, bloating, constipation EXAM: CT ABDOMEN AND PELVIS WITH CONTRAST TECHNIQUE: Multidetector CT imaging of the abdomen and pelvis was performed using the standard protocol following bolus administration of intravenous contrast. CONTRAST:  OMNIPAQUE IOHEXOL 300 MG/ML  SOLN COMPARISON:  None. FINDINGS: Lower chest:  Mild bibasilar atelectasis.  Normal heart size. Hepatobiliary: Normal gallbladder. Diffuse low attenuation of the liver as can be seen with hepatic steatosis. Pancreas: Pancreas enhances normally. No pancreatic mass. Mild peripancreatic inflammatory changes. No surrounding fluid collection. Spleen: Normal. Adrenals/Urinary Tract: Normal adrenal glands. Normal kidneys. No  urolithiasis or obstructive uropathy. Normal bladder. Stomach/Bowel: No bowel dilatation to suggest obstruction. Mild inflammatory changes around the duodenum with mild duodenal wall thickening. No pneumatosis, pneumoperitoneum or portal venous gas. Small amount of right lower quadrant free fluid. Normal appendix. Vascular/Lymphatic: Normal caliber abdominal aorta. No lymphadenopathy. Other: No fluid  collection or hematoma. Musculoskeletal: No acute osseous abnormality. No lytic or sclerotic osseous lesion. Degenerative disc disease at L5-S1 with bilateral facet arthropathy. IMPRESSION: 1. There are peripancreatic inflammatory changes as well as periduodenal inflammation and duty or wall thickening. This may reflect mild acute pancreatitis versus duodenitis. Electronically Signed   By: Elige Ko   On: 12/10/2015 12:11   Radiology and lab results reviewed extensively with patient.   Please see individual assessment and plan sections.

## 2015-12-12 NOTE — Patient Instructions (Signed)
Thank you for coming in today. You were seen in follow up for your belly pain We think this was due to gastritis and duodenitis (inflammation of the first part of your intestine) from stomach acid and/or infection with H. Pylori We will get bloodowork today to ensure your labs are returning to normal Advance your diet as you are able to tolerate food Omepralzole twice daily for 1 week then stop Sucralfate 4 times a day then stop Continue 1 miralax and 1 benefiber a day  Stop docusate Follow up in 2 weeks or sooner if worsening

## 2015-12-13 NOTE — Progress Notes (Signed)
Quick Note:  Labs are improving. We'll continue to follow. ______

## 2016-01-06 ENCOUNTER — Ambulatory Visit: Payer: BC Managed Care – PPO | Admitting: Family Medicine

## 2016-01-10 ENCOUNTER — Encounter: Payer: Self-pay | Admitting: Gastroenterology

## 2016-03-12 ENCOUNTER — Encounter: Payer: Self-pay | Admitting: Family Medicine

## 2016-03-12 ENCOUNTER — Ambulatory Visit (INDEPENDENT_AMBULATORY_CARE_PROVIDER_SITE_OTHER): Payer: BC Managed Care – PPO | Admitting: Family Medicine

## 2016-03-12 VITALS — BP 144/92 | HR 72 | Temp 97.9°F | Wt 241.0 lb

## 2016-03-12 DIAGNOSIS — J029 Acute pharyngitis, unspecified: Secondary | ICD-10-CM

## 2016-03-12 LAB — POCT RAPID STREP A (OFFICE): Rapid Strep A Screen: NEGATIVE

## 2016-03-12 MED ORDER — PREDNISONE 10 MG PO TABS: 30.0000 mg | ORAL_TABLET | Freq: Every day | ORAL | Status: DC

## 2016-03-12 MED ORDER — AZITHROMYCIN 250 MG PO TABS: 250.0000 mg | ORAL_TABLET | Freq: Every day | ORAL | Status: DC

## 2016-03-12 NOTE — Progress Notes (Signed)
       Benjamin Singleton is a 50 y.o. male who presents [facility]: [facility] Care today for Sore throat and congestion present for a week or so worsening recently. Patient notes a mild nonproductive cough.No fevers chills nausea vomiting or diarrhea. He has tried Claritin which did not help. He feels well otherwise.   No past medical history on file. No past surgical history on file. Social History  Substance Use Topics  . Smoking status: Never Smoker   . Smokeless tobacco: Not on file  . Alcohol Use: Yes     Comment: social   family history includes Cancer in his other; Diabetes in his other; Hypertension in his mother and other.  ROS as above Medications: Current Outpatient Prescriptions  Medication Sig Dispense Refill  . alum & mag hydroxide-simeth (MAALOX PLUS) 400-400-40 MG/5ML suspension Take 10 mLs by mouth every 6 (six) hours as needed for indigestion.    . Loratadine (CLARITIN) 10 MG CAPS Take by mouth.    . Multiple Vitamin (MULTIVITAMIN WITH MINERALS) TABS tablet Take 1 tablet by mouth daily.    . Omega-3 Fatty Acids (FISH OIL PO) Take by mouth.    Marland Kitchen. omeprazole (PRILOSEC) 40 MG capsule Take 1 capsule (40 mg total) by mouth daily. 30 capsule 3  . ondansetron (ZOFRAN) 8 MG tablet Take 1 tablet (8 mg total) by mouth every 8 (eight) hours as needed for nausea or vomiting. 20 tablet 1  . polyethylene glycol powder (GLYCOLAX/MIRALAX) powder Take 17 g by mouth daily. 850 g 1  . simethicone (MYLICON) 80 MG chewable tablet Chew 160 mg by mouth every 6 (six) hours as needed for flatulence.    . sucralfate (CARAFATE) 1 g tablet Take 1 tablet (1 g total) by mouth 4 (four) times daily -  with meals and at bedtime. 56 tablet 0  . azithromycin (ZITHROMAX) 250 MG tablet Take 1 tablet (250 mg total) by mouth daily. Take first 2 tablets together, then 1 every day until finished. 6 tablet 0  . predniSONE  (DELTASONE) 10 MG tablet Take 3 tablets (30 mg total) by mouth daily with breakfast. 15 tablet 0   No current facility-administered medications for this visit.   No Known Allergies   Exam:  BP 144/92 mmHg  Pulse 72  Temp(Src) 97.9 F (36.6 C) (Oral)  Wt 241 lb (109.317 kg)  SpO2 96% Gen: Well NAD HEENT: EOMI,  MMM clear nasal discharge. Posterior pharynx with cobblestoning. Left tonsil stone is present. Normal tympanic membranes bilaterally. No cervical lymphadenopathy bilaterally. Lungs: Normal work of breathing. CTABL Heart: RRR no MRG Abd: NABS, Soft. Nondistended, Nontender Exts: Brisk capillary refill, warm and well perfused.   Results for orders placed or performed in visit on 03/12/16 (from the past 24 hour(s))  POCT rapid strep A     Status: Normal   Collection Time: 03/12/16 11:53 AM  Result Value Ref Range   Rapid Strep A Screen Negative Negative   No results found.    50 year old male with viral pharyngitis. Treat with prednisone and Tylenol. Continue Claritin Use azithromycin if not better.

## 2016-03-12 NOTE — Patient Instructions (Signed)
Thank you for coming in today. Continue the Claritin.  Take prednisone daily.  Use azithromycin antibiotics if not better.  Take over the counter tylenol as well.   Call or go to the emergency room if you get worse, have trouble breathing, have chest pains, or palpitations.   Pharyngitis Pharyngitis is redness, pain, and swelling (inflammation) of your pharynx.  CAUSES  Pharyngitis is usually caused by infection. Most of the time, these infections are from viruses (viral) and are part of a cold. However, sometimes pharyngitis is caused by bacteria (bacterial). Pharyngitis can also be caused by allergies. Viral pharyngitis may be spread from person to person by coughing, sneezing, and personal items or utensils (cups, forks, spoons, toothbrushes). Bacterial pharyngitis may be spread from person to person by more intimate contact, such as kissing.  SIGNS AND SYMPTOMS  Symptoms of pharyngitis include:   Sore throat.   Tiredness (fatigue).   Low-grade fever.   Headache.  Joint pain and muscle aches.  Skin rashes.  Swollen lymph nodes.  Plaque-like film on throat or tonsils (often seen with bacterial pharyngitis). DIAGNOSIS  Your health care provider will ask you questions about your illness and your symptoms. Your medical history, along with a physical exam, is often all that is needed to diagnose pharyngitis. Sometimes, a rapid strep test is done. Other lab tests may also be done, depending on the suspected cause.  TREATMENT  Viral pharyngitis will usually get better in 3-4 days without the use of medicine. Bacterial pharyngitis is treated with medicines that kill germs (antibiotics).  HOME CARE INSTRUCTIONS   Drink enough water and fluids to keep your urine clear or pale yellow.   Only take over-the-counter or prescription medicines as directed by your health care provider:   If you are prescribed antibiotics, make sure you finish them even if you start to feel better.    Do not take aspirin.   Get lots of rest.   Gargle with 8 oz of salt water ( tsp of salt per 1 qt of water) as often as every 1-2 hours to soothe your throat.   Throat lozenges (if you are not at risk for choking) or sprays may be used to soothe your throat. SEEK MEDICAL CARE IF:   You have large, tender lumps in your neck.  You have a rash.  You cough up green, yellow-brown, or bloody spit. SEEK IMMEDIATE MEDICAL CARE IF:   Your neck becomes stiff.  You drool or are unable to swallow liquids.  You vomit or are unable to keep medicines or liquids down.  You have severe pain that does not go away with the use of recommended medicines.  You have trouble breathing (not caused by a stuffy nose). MAKE SURE YOU:   Understand these instructions.  Will watch your condition.  Will get help right away if you are not doing well or get worse.   This information is not intended to replace advice given to you by your health care provider. Make sure you discuss any questions you have with your health care provider.   Document Released: 11/09/2005 Document Revised: 08/30/2013 Document Reviewed: 07/17/2013 Elsevier Interactive Patient Education [employer]2016 Elsevier Inc.

## 2016-04-09 ENCOUNTER — Encounter: Payer: Self-pay | Admitting: Family Medicine

## 2016-04-09 ENCOUNTER — Ambulatory Visit (INDEPENDENT_AMBULATORY_CARE_PROVIDER_SITE_OTHER): Payer: BC Managed Care – PPO | Admitting: Family Medicine

## 2016-04-09 VITALS — BP 127/75 | HR 73 | Temp 98.0°F | Wt 236.0 lb

## 2016-04-09 DIAGNOSIS — J069 Acute upper respiratory infection, unspecified: Secondary | ICD-10-CM

## 2016-04-09 MED ORDER — AZITHROMYCIN 250 MG PO TABS: 250.0000 mg | ORAL_TABLET | Freq: Every day | ORAL | Status: DC

## 2016-04-09 MED ORDER — GUAIFENESIN-CODEINE 100-10 MG/5ML PO SOLN: 5.0000 mL | Freq: Every evening | ORAL | Status: DC | PRN

## 2016-04-09 MED ORDER — BENZONATATE 200 MG PO CAPS: 200.0000 mg | ORAL_CAPSULE | Freq: Three times a day (TID) | ORAL | Status: DC | PRN

## 2016-04-09 NOTE — Progress Notes (Signed)
       Benjamin Singleton is a 50 y.o. male who [facility] today for cough congestion runny nose. Cough is quite bothersome. Symptoms present for 2 days. Patient's husband recently was diagnosed with a viral URI after a trip on an airplane. He notes the cough is productive with yellow to clear sputum. Additionally he notes hoarseness voice and congestion.   No past medical history on file. No past surgical history on file. Social History  Substance Use Topics  . Smoking status: Never Smoker   . Smokeless tobacco: Not on file  . Alcohol Use: Yes     Comment: social   family history includes Cancer in his other; Diabetes in his other; Hypertension in his mother and other.  ROS as above Medications: Current Outpatient Prescriptions  Medication Sig Dispense Refill  . Bioflavonoid Products (VITAMIN C) CHEW Chew by mouth.    . Loratadine (CLARITIN) 10 MG CAPS Take by mouth.    . Multiple Vitamin (MULTIVITAMIN WITH MINERALS) TABS tablet Take 1 tablet by mouth daily.    . Omega-3 Fatty Acids (FISH OIL PO) Take by mouth.    . simethicone (MYLICON) 80 MG chewable tablet Chew 160 mg by mouth every 6 (six) hours as needed for flatulence.    . sucralfate (CARAFATE) 1 g tablet Take 1 tablet (1 g total) by mouth 4 (four) times daily -  with meals and at bedtime. 56 tablet 0  . azithromycin (ZITHROMAX) 250 MG tablet Take 1 tablet (250 mg total) by mouth daily. Take first 2 tablets together, then 1 every day until finished. 6 tablet 0  . benzonatate (TESSALON) 200 MG capsule Take 1 capsule (200 mg total) by mouth 3 (three) times daily as needed for cough. 45 capsule 0  . guaiFENesin-codeine 100-10 MG/5ML syrup Take 5 mLs by mouth at bedtime as needed for cough. 120 mL 0   No current facility-administered medications for this visit.   No Known Allergies   Exam:  BP 127/75 mmHg  Pulse 73   Temp(Src) 98 F (36.7 C) (Oral)  Wt 236 lb (107.049 kg)  SpO2 96% Gen: Well NAD Nontoxic appearing HEENT: EOMI,  MMM with your pharynx with cobblestoning. Normal tympanic membranes bilaterally Lungs: Normal work of breathing. CTABL frequent cough Heart: RRR no MRG Abd: NABS, Soft. Nondistended, Nontender Exts: Brisk capillary refill, warm and well perfused.   No results found for this or any previous visit (from the past 24 hour(s)). No results found.    50 year old male with bronchitis. Treatment with azithromycin and Tessalon Perles and codeine cough syrup. Use prednisone if not better.

## 2016-04-09 NOTE — Patient Instructions (Signed)
Thank you for coming in today. Take azithromycin and tessalon.  Use codeine as needed at night.  Call or go to the emergency room if you get worse, have trouble breathing, have chest pains, or palpitations.   Upper Respiratory Infection, Adult Most upper respiratory infections (URIs) are a viral infection of the air passages leading to the lungs. A URI affects the nose, throat, and upper air passages. The most common type of URI is nasopharyngitis and is typically referred to as "the common cold." URIs run their course and usually go away on their own. Most of the time, a URI does not require medical attention, but sometimes a bacterial infection in the upper airways can follow a viral infection. This is called a secondary infection. Sinus and middle ear infections are common types of secondary upper respiratory infections. Bacterial pneumonia can also complicate a URI. A URI can worsen asthma and chronic obstructive pulmonary disease (COPD). Sometimes, these complications can require emergency medical care and may be life threatening.  CAUSES Almost all URIs are caused by viruses. A virus is a type of germ and can spread from one person to another.  RISKS FACTORS You may be at risk for a URI if:   You smoke.   You have chronic heart or lung disease.  You have a weakened defense (immune) system.   You are very young or very old.   You have nasal allergies or asthma.  You work in crowded or poorly ventilated areas.  You work in health care facilities or schools. SIGNS AND SYMPTOMS  Symptoms typically develop 2-3 days after you come in contact with a cold virus. Most viral URIs last 7-10 days. However, viral URIs from the influenza virus (flu virus) can last 14-18 days and are typically more severe. Symptoms may include:   Runny or stuffy (congested) nose.   Sneezing.   Cough.   Sore throat.   Headache.   Fatigue.   Fever.   Loss of appetite.   Pain in your  forehead, behind your eyes, and over your cheekbones (sinus pain).  Muscle aches.  DIAGNOSIS  Your health care provider may diagnose a URI by:  Physical exam.  Tests to check that your symptoms are not due to another condition such as:  Strep throat.  Sinusitis.  Pneumonia.  Asthma. TREATMENT  A URI goes away on its own with time. It cannot be cured with medicines, but medicines may be prescribed or recommended to relieve symptoms. Medicines may help:  Reduce your fever.  Reduce your cough.  Relieve nasal congestion. HOME CARE INSTRUCTIONS   Take medicines only as directed by your health care provider.   Gargle warm saltwater or take cough drops to comfort your throat as directed by your health care provider.  Use a warm mist humidifier or inhale steam from a shower to increase air moisture. This may make it easier to breathe.  Drink enough fluid to keep your urine clear or pale yellow.   Eat soups and other clear broths and maintain good nutrition.   Rest as needed.   Return to work when your temperature has returned to normal or as your health care provider advises. You may need to stay home longer to avoid infecting others. You can also use a face mask and careful hand washing to prevent spread of the virus.  Increase the usage of your inhaler if you have asthma.   Do not use any tobacco products, including cigarettes, chewing tobacco, or electronic  cigarettes. If you need help quitting, ask your health care provider. PREVENTION  The best way to protect yourself from getting a cold is to practice good hygiene.   Avoid oral or hand contact with people with cold symptoms.   Wash your hands often if contact occurs.  There is no clear evidence that vitamin C, vitamin E, echinacea, or exercise reduces the chance of developing a cold. However, it is always recommended to get plenty of rest, exercise, and practice good nutrition.  SEEK MEDICAL CARE IF:   You  are getting worse rather than better.   Your symptoms are not controlled by medicine.   You have chills.  You have worsening shortness of breath.  You have brown or red mucus.  You have yellow or brown nasal discharge.  You have pain in your face, especially when you bend forward.  You have a fever.  You have swollen neck glands.  You have pain while swallowing.  You have white areas in the back of your throat. SEEK IMMEDIATE MEDICAL CARE IF:   You have severe or persistent:  Headache.  Ear pain.  Sinus pain.  Chest pain.  You have chronic lung disease and any of the following:  Wheezing.  Prolonged cough.  Coughing up blood.  A change in your usual mucus.  You have a stiff neck.  You have changes in your:  Vision.  Hearing.  Thinking.  Mood. MAKE SURE YOU:   Understand these instructions.  Will watch your condition.  Will get help right away if you are not doing well or get worse.   This information is not intended to replace advice given to you by your health care provider. Make sure you discuss any questions you have with your health care provider.   Document Released: 05/05/2001 Document Revised: 03/26/2015 Document Reviewed: 02/14/2014 Elsevier Interactive Patient Education [employer].

## 2016-04-11 ENCOUNTER — Encounter: Payer: Self-pay | Admitting: Family Medicine

## 2016-04-12 ENCOUNTER — Ambulatory Visit (INDEPENDENT_AMBULATORY_CARE_PROVIDER_SITE_OTHER): Payer: BC Managed Care – PPO

## 2016-04-12 ENCOUNTER — Ambulatory Visit (HOSPITAL_COMMUNITY)
Admission: EM | Admit: 2016-04-12 | Discharge: 2016-04-12 | Disposition: A | Discharge status: Home / Self Care | Payer: BC Managed Care – PPO | Attending: Emergency Medicine | Admitting: Emergency Medicine

## 2016-04-12 ENCOUNTER — Encounter (HOSPITAL_COMMUNITY): Payer: Self-pay

## 2016-04-12 DIAGNOSIS — R05 Cough: Secondary | ICD-10-CM | POA: Diagnosis not present

## 2016-04-12 DIAGNOSIS — R059 Cough, unspecified: Secondary | ICD-10-CM

## 2016-04-12 MED ORDER — IPRATROPIUM BROMIDE 0.06 % NA SOLN: 2.0000 | Freq: Four times a day (QID) | NASAL | Status: DC

## 2016-04-12 MED ORDER — GUAIFENESIN-CODEINE 100-10 MG/5ML PO SOLN: 10.0000 mL | ORAL | Status: DC | PRN

## 2016-04-12 NOTE — ED Provider Notes (Signed)
HPI  SUBJECTIVE:  Benjamin Singleton is a 50 y.o. male who presents with coughing spells for the past week. He states that he is unable to sleep at night secondary to the coughing. He reports nasal congestion, a "tickle in the back of my throat", rhinorrhea, fatigue. Reports shortness of breath secondary to coughing, but denies any other shortness of breath. Symptoms are worse with talking and going into the air conditioning, better with Cheratussin, Tessalon Perles. He is also currently on azithromycin for this. No vomiting, fevers, posttussive emesis, rhinorrhea, postnasal drip, wheezing, chest pain, bodyaches. No sick contacts. No burning chest pain, water brash. No hemoptysis. He was seen by his primary care physician earlier this week, started on azithromycin, Tessalon, Cheratussin 5 mL at bedtime. He is wondering if he can take more of the cough syrup. Past medical history of bronchitis, negative for GERD, asthma, emphysema, COPD, smoking, diabetes, hypertension. PMD: Dr. Lady Saucier.    History reviewed. No pertinent past medical history.  History reviewed. No pertinent past surgical history.  Family History  Problem Relation Age of Onset  . Hypertension Mother   . Hypertension Other   . Diabetes Other   . Cancer Other     Social History  Substance Use Topics  . Smoking status: Never Smoker   . Smokeless tobacco: Never Used  . Alcohol Use: Yes     Comment: social    No current facility-administered medications for this encounter.  Current outpatient prescriptions:  .  azithromycin (ZITHROMAX) 250 MG tablet, Take 1 tablet (250 mg total) by mouth daily. Take first 2 tablets together, then 1 every day until finished., Disp: 6 tablet, Rfl: 0 .  benzonatate (TESSALON) 200 MG capsule, Take 1 capsule (200 mg total) by mouth 3 (three) times daily as needed for cough., Disp: 45 capsule, Rfl: 0 .  Bioflavonoid Products (VITAMIN C) CHEW, Chew by mouth., Disp: , Rfl:  .  Multiple Vitamin  (MULTIVITAMIN WITH MINERALS) TABS tablet, Take 1 tablet by mouth daily., Disp: , Rfl:  .  Omega-3 Fatty Acids (FISH OIL PO), Take by mouth., Disp: , Rfl:  .  guaiFENesin-codeine 100-10 MG/5ML syrup, Take 10 mLs by mouth every 4 (four) hours as needed for cough., Disp: 120 mL, Rfl: 0 .  ipratropium (ATROVENT) 0.06 % nasal spray, Place 2 sprays into both nostrils 4 (four) times daily. 3-4 times/ day, Disp: 15 mL, Rfl: 0 .  Loratadine (CLARITIN) 10 MG CAPS, Take by mouth., Disp: , Rfl:  .  simethicone (MYLICON) 80 MG chewable tablet, Chew 160 mg by mouth every 6 (six) hours as needed for flatulence., Disp: , Rfl:  .  sucralfate (CARAFATE) 1 g tablet, Take 1 tablet (1 g total) by mouth 4 (four) times daily -  with meals and at bedtime., Disp: 56 tablet, Rfl: 0  No Known Allergies   ROS  As noted in HPI.   Physical Exam  BP 128/75 mmHg  Pulse 66  Temp(Src) 99.1 F (37.3 C) (Oral)  Resp 20  SpO2 97%  Constitutional: Well developed, well nourished, no acute distress Eyes:  EOMI, conjunctiva normal bilaterally HENT: Normocephalic, atraumatic,mucus membranes moist. No nasal congestion. Irritated oropharynx. Positive postnasal drip. Respiratory: Normal inspiratory effort good air movement, lungs clear bilaterally Cardiovascular: Normal rate, regular rhythm, no murmurs, rubs, gallops GI: nondistended skin: No rash, skin intact Musculoskeletal: no deformities Neurologic: Alert & oriented x 3, no focal neuro deficits Psychiatric: Speech and behavior appropriate   ED Course   Medications -  No data to display  Orders Placed This Encounter  Procedures  . DG Chest 2 View    Standing Status: Standing     Number of Occurrences: 1     Standing Expiration Date:     Order Specific Question:  Reason for Exam (SYMPTOM  OR DIAGNOSIS REQUIRED)    Answer:  cough r/o PNA    No results found for this or any previous visit (from the past 24 hour(s)). Dg Chest 2 View  04/12/2016  CLINICAL DATA:   Cough.  No fever. EXAM: CHEST  2 VIEW COMPARISON:  October 12, 2014 FINDINGS: October 13, 1999 IMPRESSION: No active cardiopulmonary disease. Electronically Signed   By: Gerome Samavid  Williams III M.D   On: 04/12/2016 15:53    ED Clinical Impression  Cough  ED Assessment/Plan  Reviewed x-ray. No pneumonia. No cardiopulmonary disease. See radiology report for details.   Presentation consistent with cough, most likely from postnasal drip/seasonal allergies. We will increase the amount and frequency of Cheratussin, have him restart his Claritin. Continue Tessalon Perles. We'll start ipratropium nasal spray. He is already covered for an pneumonia. In the absence of wheezing, or history of asthma deferring albuterol with spacer and prednisone. Follow Up with PMD as needed. To the ER if gets worse.   Discussed imaging, MDM, plan and followup with patient. Discussed sn/sx that should prompt return to the ED. Patient agrees with plan.   *This clinic note was created using Dragon dictation software. Therefore, there may be occasional mistakes despite careful proofreading.  ?   Domenick GongAshley Keyton Bhat, MD 04/13/16 1019

## 2016-04-12 NOTE — ED Notes (Signed)
Patient states he has had this bad cough x1 week, pt has hx of bronchitis and wants to be checked for pneumonia, pt has taken tessalon pearls and cough syrup w/codeine to treat symptoms. No acute distress

## 2016-04-13 MED ORDER — PREDNISONE 10 MG PO TABS: 30.0000 mg | ORAL_TABLET | Freq: Every day | ORAL | Status: DC

## 2016-04-13 MED ORDER — HYDROCODONE-HOMATROPINE 5-1.5 MG/5ML PO SYRP: 5.0000 mL | ORAL_SOLUTION | Freq: Three times a day (TID) | ORAL | Status: DC | PRN

## 2016-07-08 ENCOUNTER — Encounter (HOSPITAL_COMMUNITY): Payer: Self-pay | Admitting: Emergency Medicine

## 2016-07-08 ENCOUNTER — Ambulatory Visit (HOSPITAL_COMMUNITY)
Admission: EM | Admit: 2016-07-08 | Discharge: 2016-07-08 | Disposition: A | Discharge status: Home / Self Care | Payer: BC Managed Care – PPO | Attending: Family Medicine | Admitting: Family Medicine

## 2016-07-08 DIAGNOSIS — R6883 Chills (without fever): Secondary | ICD-10-CM

## 2016-07-08 DIAGNOSIS — R42 Dizziness and giddiness: Secondary | ICD-10-CM

## 2016-07-08 NOTE — ED Triage Notes (Signed)
[facility] with a complaint of RUQ abdominal pain that started yesterday. The patient also complained of a headache with chills and nausea today.

## 2016-07-08 NOTE — ED Provider Notes (Signed)
CSN: [id number]     Arrival date & time 07/08/16  1920 History   First MD Initiated Contact with Benjamin Singleton 07/08/16 2000     Chief Complaint  Benjamin Singleton presents with  . Abdominal Pain   (Consider location/radiation/quality/duration/timing/severity/associated sxs/prior Treatment) Benjamin Singleton is a 50 y.o male with no medical history presents today with concern for dizziness, headache and chills. Benjamin Singleton had a stressful day at work yesterday; Benjamin Singleton worked a long hour shift without rest and without food. Today Benjamin Singleton started having intermittent headache at left temporal, headache was 4/10 and is now 2/10 in room. Dizziness also started today and is also intermittent.; no dizziness currently in room. Benjamin Singleton endorses nausea and seeing gray dots. Benjamin Singleton denies photosensitivity, vomiting or vertigo. Benjamin Singleton had one episodes of feeling really cold that lasted 2 hours; spouse came home found him curl up and wrapped up in 3 blanket.  Spouse is particularly concern for this one episode of cold intolerance and dizziness.       History reviewed. No pertinent past medical history. History reviewed. No pertinent surgical history. Family History  Problem Relation Age of Onset  . Hypertension Mother   . Hypertension Other   . Diabetes Other   . Cancer Other    Social History  Substance Use Topics  . Smoking status: Never Smoker  . Smokeless tobacco: Never Used  . Alcohol use Yes     Comment: social    Review of Systems  Constitutional: Negative for fatigue and fever.       Positive for 2-hr of feeling very cold  HENT: Negative for congestion, ear pain, rhinorrhea, sneezing and sore throat.   Eyes: Negative for redness and itching.       Positive for seeing gray dots.   Respiratory: Negative for cough and shortness of breath.   Cardiovascular: Negative for chest pain, palpitations and leg swelling.  Gastrointestinal: Positive for abdominal pain and nausea. Negative for diarrhea and vomiting.       Had one episodes of  abdominal pain at left side but went away  Musculoskeletal: Negative for arthralgias and myalgias.  Skin: Negative for color change, pallor and rash.  Neurological: Positive for dizziness and headaches.       Both intermittent; denies both currently    Allergies  Review of Benjamin Singleton's allergies indicates no known allergies.  Home Medications   Prior to Admission medications   Medication Sig Start Date End Date Taking? Authorizing Provider  Bioflavonoid Products (VITAMIN C) CHEW Chew by mouth.   Yes Historical Provider, MD  Loratadine (CLARITIN) 10 MG CAPS Take by mouth.   Yes Historical Provider, MD  Multiple Vitamin (MULTIVITAMIN WITH MINERALS) TABS tablet Take 1 tablet by mouth daily.   Yes Historical Provider, MD  Omega-3 Fatty Acids (FISH OIL PO) Take by mouth.   Yes Historical Provider, MD  azithromycin (ZITHROMAX) 250 MG tablet Take 1 tablet (250 mg total) by mouth daily. Take first 2 tablets together, then 1 every day until finished. 04/09/16   Rodolph BongEvan S Corey, MD  benzonatate (TESSALON) 200 MG capsule Take 1 capsule (200 mg total) by mouth 3 (three) times daily as needed for cough. 04/09/16   Rodolph BongEvan S Corey, MD  guaiFENesin-codeine 100-10 MG/5ML syrup Take 10 mLs by mouth every 4 (four) hours as needed for cough. 04/12/16   Domenick GongAshley Mortenson, MD  HYDROcodone-homatropine [facility](HYCODAN) 5-1.5 MG/5ML syrup Take 5 mLs by mouth every 8 (eight) hours as needed for cough. 04/13/16   Rodolph BongEvan S Corey, MD  ipratropium (ATROVENT) 0.06 % nasal spray Place 2 sprays into both nostrils 4 (four) times daily. 3-4 times/ day 04/12/16   Domenick GongAshley Mortenson, MD  predniSONE (DELTASONE) 10 MG tablet Take 3 tablets (30 mg total) by mouth daily with breakfast. 04/13/16   Rodolph BongEvan S Corey, MD  simethicone (MYLICON) 80 MG chewable tablet Chew 160 mg by mouth every 6 (six) hours as needed for flatulence.    Historical Provider, MD  sucralfate (CARAFATE) 1 g tablet Take 1 tablet (1 g total) by mouth 4 (four) times daily -  with meals and  at bedtime. 12/09/15   Laurence Spatesachel Morgan Little, MD   Meds Ordered and Administered this Visit  Medications - No data to display  BP 134/76 (BP Location: Right Arm)   Pulse 71   Temp 97.7 F (36.5 C) (Oral)   Resp 18   SpO2 95%  No data found.   Physical Exam  Constitutional: Benjamin Singleton is oriented to person, place, and time. Benjamin Singleton appears well-developed and well-nourished. No distress.  HENT:  Head: Normocephalic and atraumatic.  Right Ear: External ear normal.  Left Ear: External ear normal.  Nose: Nose normal.  Mouth/Throat: Oropharynx is clear and moist. No oropharyngeal exudate.  Eyes: Conjunctivae and EOM are normal. Pupils are equal, round, and reactive to light.  Neck: Normal range of motion. Neck supple.  Cardiovascular: Normal rate, regular rhythm and intact distal pulses.   No murmur heard. S3 heart sound present  Pulmonary/Chest: Effort normal and breath sounds normal. No respiratory distress. Benjamin Singleton has no wheezes.  Abdominal: Soft. Bowel sounds are normal. Benjamin Singleton exhibits no distension and no mass. There is no tenderness.  Lymphadenopathy:    Benjamin Singleton has no cervical adenopathy.  Neurological: Benjamin Singleton is alert and oriented to person, place, and time. No cranial nerve deficit. Benjamin Singleton exhibits normal muscle tone. Coordination normal.  Skin: Skin is warm and dry. Capillary refill takes less than 2 seconds. No rash noted. Benjamin Singleton is not diaphoretic. No pallor.  Psychiatric: Benjamin Singleton has a normal mood and affect.    Urgent Care Course   Clinical Course    Procedures (including critical care time)  Labs Review Labs Reviewed - No data to display  Imaging Review No results found.   Visual Acuity Review  Right Eye Distance:   Left Eye Distance:   Bilateral Distance:    Right Eye Near:   Left Eye Near:    Bilateral Near:         MDM   1. Dizziness   2. Chills (without fever)    Physical examination was normal except for a questionable S3 heart sound; doubt this is the cause of his headache and  dizziness; instructed to f/u with PCP for confirmation and possible cardiology evaluation. Neurological exam intact.  Headache most likely tension given that Benjamin Singleton had a stressful day the day prior. Headache is 2/10 currently; instructed to take some tylenol or ibuprofen at home. No clear etiology for his dizziness and his chills, but both symptoms subsided. Benjamin Singleton discharged home in good condition. Informed to seek emergency care if symptoms return. Instructed to f/u with PCP tomorrow or Friday.    Lucia EstelleFeng Nollan Muldrow, NP 07/08/16 2119

## 2016-07-16 ENCOUNTER — Encounter: Payer: Self-pay | Admitting: Family Medicine

## 2016-10-26 ENCOUNTER — Ambulatory Visit (HOSPITAL_COMMUNITY)
Admission: EM | Admit: 2016-10-26 | Discharge: 2016-10-26 | Disposition: A | Discharge status: Home / Self Care | Payer: BC Managed Care – PPO | Attending: Family Medicine | Admitting: Family Medicine

## 2016-10-26 ENCOUNTER — Encounter (HOSPITAL_COMMUNITY): Payer: Self-pay | Admitting: *Deleted

## 2016-10-26 DIAGNOSIS — J0100 Acute maxillary sinusitis, unspecified: Secondary | ICD-10-CM

## 2016-10-26 MED ORDER — IPRATROPIUM BROMIDE 0.06 % NA SOLN: 2.0000 | Freq: Four times a day (QID) | NASAL | 1 refills | Status: DC

## 2016-10-26 MED ORDER — DOXYCYCLINE HYCLATE 100 MG PO CAPS: 100.0000 mg | ORAL_CAPSULE | Freq: Two times a day (BID) | ORAL | 0 refills | Status: DC

## 2016-10-26 MED ORDER — GUAIFENESIN-CODEINE 100-10 MG/5ML PO SYRP: 10.0000 mL | ORAL_SOLUTION | Freq: Four times a day (QID) | ORAL | 0 refills | Status: DC | PRN

## 2016-10-26 NOTE — Discharge Instructions (Signed)
Drink plenty of fluids as discussed, use medicine as prescribed, and mucinex or delsym for cough. Return or see your doctor if further problems °

## 2016-10-26 NOTE — ED Triage Notes (Signed)
Pt  Reports  Symptoms  Of  Cough     Congested      Sinus  Drainage       With  Symptoms    X    4-5  Days

## 2016-10-26 NOTE — ED Provider Notes (Signed)
[facility]    CSN: [id number] Arrival date & time: 10/26/16  1203     History   Chief Complaint Chief Complaint  Patient presents with  . URI    HPI Benjamin Singleton is a 50 y.o. male.   The history is provided by the patient.  URI  Presenting symptoms: congestion, cough and rhinorrhea   Presenting symptoms: no fever and no sore throat   Severity:  Moderate Onset quality:  Gradual Duration:  5 days Progression:  Unchanged Chronicity:  New Relieved by:  None tried Worsened by:  Nothing Ineffective treatments:  None tried   History reviewed. No pertinent past medical history.  Patient Active Problem List   Diagnosis Date Noted  . Constipation 12/10/2015  . Abdominal pain 12/10/2015  . Gastritis 06/14/2015  . GERD (gastroesophageal reflux disease) 06/14/2015  . Obese 06/14/2015  . Snores 06/14/2015    History reviewed. No pertinent surgical history.     Home Medications    Prior to Admission medications   Medication Sig Start Date End Date Taking? Authorizing Provider  azithromycin (ZITHROMAX) 250 MG tablet Take 1 tablet (250 mg total) by mouth daily. Take first 2 tablets together, then 1 every day until finished. 04/09/16   Rodolph BongEvan S Corey, MD  benzonatate (TESSALON) 200 MG capsule Take 1 capsule (200 mg total) by mouth 3 (three) times daily as needed for cough. 04/09/16   Rodolph BongEvan S Corey, MD  Bioflavonoid Products (VITAMIN C) CHEW Chew by mouth.    Historical Provider, MD  guaiFENesin-codeine 100-10 MG/5ML syrup Take 10 mLs by mouth every 4 (four) hours as needed for cough. 04/12/16   Domenick GongAshley Mortenson, MD  HYDROcodone-homatropine [facility](HYCODAN) 5-1.5 MG/5ML syrup Take 5 mLs by mouth every 8 (eight) hours as needed for cough. 04/13/16   Rodolph BongEvan S Corey, MD  ipratropium (ATROVENT) 0.06 % nasal spray Place 2 sprays into both nostrils 4 (four) times daily. 3-4 times/ day 04/12/16   Domenick GongAshley Mortenson, MD  Loratadine (CLARITIN) 10 MG CAPS Take by mouth.    Historical  Provider, MD  Multiple Vitamin (MULTIVITAMIN WITH MINERALS) TABS tablet Take 1 tablet by mouth daily.    Historical Provider, MD  Omega-3 Fatty Acids (FISH OIL PO) Take by mouth.    Historical Provider, MD  predniSONE (DELTASONE) 10 MG tablet Take 3 tablets (30 mg total) by mouth daily with breakfast. 04/13/16   Rodolph BongEvan S Corey, MD  simethicone (MYLICON) 80 MG chewable tablet Chew 160 mg by mouth every 6 (six) hours as needed for flatulence.    Historical Provider, MD  sucralfate (CARAFATE) 1 g tablet Take 1 tablet (1 g total) by mouth 4 (four) times daily -  with meals and at bedtime. 12/09/15   Laurence Spatesachel Morgan Little, MD    Family History Family History  Problem Relation Age of Onset  . Hypertension Mother   . Hypertension Other   . Diabetes Other   . Cancer Other     Social History Social History  Substance Use Topics  . Smoking status: Never Smoker  . Smokeless tobacco: Never Used  . Alcohol use Yes     Comment: social     Allergies   Patient has no known allergies.   Review of Systems Review of Systems  Constitutional: Negative for fever.  HENT: Positive for congestion, postnasal drip and rhinorrhea. Negative for sore throat.   Respiratory: Positive for cough. Negative for shortness of breath.   Cardiovascular: Negative.   Gastrointestinal: Negative.   All other  systems reviewed and are negative.    Physical Exam Triage Vital Signs ED Triage Vitals [10/26/16 1237]  Enc Vitals Group     BP 140/84     Pulse Rate 76     Resp 16     Temp 98.9 F (37.2 C)     Temp Source Oral     SpO2 97 %     Weight      Height      Head Circumference      Peak Flow      Pain Score      Pain Loc      Pain Edu?      Excl. in GC?    No data found.   Updated Vital Signs BP 140/84 (BP Location: Right Arm)   Pulse 76   Temp 98.9 F (37.2 C) (Oral)   Resp 16   SpO2 97%   Visual Acuity Right Eye Distance:   Left Eye Distance:   Bilateral Distance:    Right Eye Near:     Left Eye Near:    Bilateral Near:     Physical Exam  Constitutional: He is oriented to person, place, and time. He appears well-developed and well-nourished.  HENT:  Right Ear: External ear normal.  Left Ear: External ear normal.  Nose: Nose normal.  Mouth/Throat: Oropharynx is clear and moist.  Eyes: Conjunctivae are normal. Pupils are equal, round, and reactive to light.  Neck: Normal range of motion.  Cardiovascular: Normal rate, regular rhythm, normal heart sounds and intact distal pulses.   Pulmonary/Chest: Effort normal.  Lymphadenopathy:    He has no cervical adenopathy.  Neurological: He is alert and oriented to person, place, and time.  Skin: Skin is warm and dry.  Nursing note and vitals reviewed.    UC Treatments / Results  Labs (all labs ordered are listed, but only abnormal results are displayed) Labs Reviewed - No data to display  EKG  EKG Interpretation None       Radiology No results found.  Procedures Procedures (including critical care time)  Medications Ordered in UC Medications - No data to display   Initial Impression / Assessment and Plan / UC Course  I have reviewed the triage vital signs and the nursing notes.  Pertinent labs & imaging results that were available during my care of the patient were reviewed by me and considered in my medical decision making (see chart for details).  Clinical Course       Final Clinical Impressions(s) / UC Diagnoses   Final diagnoses:  None    New Prescriptions New Prescriptions   No medications on file     Linna HoffJames D Kindl, MD 10/26/16 1517

## 2016-10-27 ENCOUNTER — Ambulatory Visit: Payer: BC Managed Care – PPO | Admitting: Family Medicine

## 2017-01-06 ENCOUNTER — Emergency Department
Admission: EM | Admit: 2017-01-06 | Discharge: 2017-01-06 | Disposition: A | Discharge status: Home / Self Care | Payer: BC Managed Care – PPO | Source: Home / Self Care | Attending: Family Medicine | Admitting: Family Medicine

## 2017-01-06 ENCOUNTER — Encounter: Payer: Self-pay | Admitting: Emergency Medicine

## 2017-01-06 ENCOUNTER — Emergency Department (INDEPENDENT_AMBULATORY_CARE_PROVIDER_SITE_OTHER): Payer: BC Managed Care – PPO

## 2017-01-06 DIAGNOSIS — W010XXA Fall on same level from slipping, tripping and stumbling without subsequent striking against object, initial encounter: Secondary | ICD-10-CM

## 2017-01-06 DIAGNOSIS — M79674 Pain in right toe(s): Secondary | ICD-10-CM

## 2017-01-06 DIAGNOSIS — W22042A Striking against wall of swimming pool causing other injury, initial encounter: Secondary | ICD-10-CM

## 2017-01-06 DIAGNOSIS — S92511A Displaced fracture of proximal phalanx of right lesser toe(s), initial encounter for closed fracture: Secondary | ICD-10-CM | POA: Diagnosis not present

## 2017-01-06 DIAGNOSIS — S92501A Displaced unspecified fracture of right lesser toe(s), initial encounter for closed fracture: Secondary | ICD-10-CM

## 2017-01-06 NOTE — ED Provider Notes (Signed)
CSN: [id number]     Arrival date & time 01/06/17  [id number] History   First MD Initiated Contact with Patient 01/06/17 [phone]     Chief Complaint  Patient presents with  . Toe Pain   (Consider location/radiation/quality/duration/timing/severity/associated sxs/prior Treatment) HPI  [name] is a 51 y.o. male presenting to UC with c/o Right 4th toe pain and bruising that started last night after stubbing his toe on the side of a pool. Pt states he slipped and fell but no other injuries during the fall. Pain is aching and sore, worse with movement and palpation. No medication taken today but he has been icing the injury. No prior injury to that foot or toe.    History reviewed. No pertinent past medical history. History reviewed. No pertinent surgical history. Family History  Problem Relation Age of Onset  . Hypertension Mother   . Hypertension Other   . Diabetes Other   . Cancer Other    Social History  Substance Use Topics  . Smoking status: Never Smoker  . Smokeless tobacco: Never Used  . Alcohol use Yes     Comment: social    Review of Systems  Musculoskeletal: Positive for arthralgias, joint swelling and myalgias.       Right 4th toe  Skin: Positive for color change. Negative for wound.       Right 4th toe  Neurological: Positive for numbness ( minimal, intermittent). Negative for weakness.    Allergies  Patient has no known allergies.  Home Medications   Prior to Admission medications   Medication Sig Start Date End Date Taking? Authorizing Provider  azithromycin (ZITHROMAX) 250 MG tablet Take 1 tablet (250 mg total) by mouth daily. Take first 2 tablets together, then 1 every day until finished. 04/09/16   Rodolph BongEvan S Corey, MD  benzonatate (TESSALON) 200 MG capsule Take 1 capsule (200 mg total) by mouth 3 (three) times daily as needed for cough. 04/09/16   Rodolph BongEvan S Corey, MD  Bioflavonoid Products (VITAMIN C) CHEW Chew by mouth.    Historical Provider, MD  doxycycline  (VIBRAMYCIN) 100 MG capsule Take 1 capsule (100 mg total) by mouth 2 (two) times daily. 10/26/16   Linna HoffJames D Kindl, MD  guaiFENesin-codeine [facility](ROBITUSSIN AC) 100-10 MG/5ML syrup Take 10 mLs by mouth 4 (four) times daily as needed for cough. 10/26/16   Linna HoffJames D Kindl, MD  HYDROcodone-homatropine [facility](HYCODAN) 5-1.5 MG/5ML syrup Take 5 mLs by mouth every 8 (eight) hours as needed for cough. 04/13/16   Rodolph BongEvan S Corey, MD  ipratropium (ATROVENT) 0.06 % nasal spray Place 2 sprays into both nostrils 4 (four) times daily. 10/26/16   Linna HoffJames D Kindl, MD  Loratadine (CLARITIN) 10 MG CAPS Take by mouth.    Historical Provider, MD  Multiple Vitamin (MULTIVITAMIN WITH MINERALS) TABS tablet Take 1 tablet by mouth daily.    Historical Provider, MD  Omega-3 Fatty Acids (FISH OIL PO) Take by mouth.    Historical Provider, MD  predniSONE (DELTASONE) 10 MG tablet Take 3 tablets (30 mg total) by mouth daily with breakfast. 04/13/16   Rodolph BongEvan S Corey, MD  simethicone (MYLICON) 80 MG chewable tablet Chew 160 mg by mouth every 6 (six) hours as needed for flatulence.    Historical Provider, MD  sucralfate (CARAFATE) 1 g tablet Take 1 tablet (1 g total) by mouth 4 (four) times daily -  with meals and at bedtime. 12/09/15   Laurence Spatesachel Morgan Little, MD   Meds Ordered and Administered this Visit  Medications - No data to display  BP 134/86 (BP Location: Right Arm)   Pulse 74   Temp 97.8 F (36.6 C) (Oral)   Wt 242 lb (109.8 kg)   SpO2 96%   BMI 34.72 kg/m  No data found.   Physical Exam  Constitutional: He is oriented to person, place, and time. He appears well-developed and well-nourished. No distress.  HENT:  Head: Normocephalic and atraumatic.  Eyes: EOM are normal.  Neck: Normal range of motion.  Cardiovascular: Normal rate.   Pulmonary/Chest: Effort normal.  Musculoskeletal: Normal range of motion. He exhibits edema and tenderness.  Right 4th toe: mild edema. Tenderness. Full ROM. No tenderness to foot or surrounding toes.   Neurological: He is alert and oriented to person, place, and time.  Skin: Skin is warm and dry. Capillary refill takes less than 2 seconds. He is not diaphoretic. No erythema.  Right 4th toe: skin in tact, ecchymosis present.   Psychiatric: He has a normal mood and affect. His behavior is normal.  Nursing note and vitals reviewed.   Urgent Care Course   Clinical Course as of Jan 06 1017  Wed Jan 06, 2017  0913 DG Foot Complete Right [EO]  0913 DG Foot Complete Right [EO]    Clinical Course User Index [EO] Junius Finner, PA-C    Procedures (including critical care time)  Labs Review Labs Reviewed - No data to display  Imaging Review Dg Foot Complete Right  Result Date: 01/06/2017 CLINICAL DATA:  Slipped and stubbed toe. EXAM: RIGHT FOOT COMPLETE - 3+ VIEW COMPARISON:  None. FINDINGS: There is a fracture of the proximal phalanx fourth digit. The fracture extends from the mid diaphysis into the distal metaphysis. Fracture enters the articular surface at the proximal interphalangeal joint. IMPRESSION: Intraarticular fracture of the proximal phalanx fourth digit Electronically Signed   By: Genevive Bi M.D.   On: 01/06/2017 09:10      MDM   1. Closed fracture of phalanx of right fourth toe, initial encounter   2. Toe pain, right   3. Fall from slip, trip, or stumble, initial encounter    Pt c/o Right 4th toe pain and bruising. Exam c/w fracture of proximal phalanx of 4th digit Skin in tact. PMS in tact.  Toes strapped using buddy taping technique, post-op shoe applied. F/u with Dr. Denyse Amass, PCP/Sports medicine, in 1 month. Patient verbalized understanding and agreement with treatment plan.      Junius Finner, PA-C 01/06/17 1019

## 2017-01-06 NOTE — ED Triage Notes (Signed)
Pt states he slipped and fell last night and c/o pain and bruising in right fourth toe. Denies previous injury. Using ice.

## 2017-03-23 ENCOUNTER — Ambulatory Visit (INDEPENDENT_AMBULATORY_CARE_PROVIDER_SITE_OTHER): Payer: BC Managed Care – PPO

## 2017-03-23 ENCOUNTER — Ambulatory Visit (INDEPENDENT_AMBULATORY_CARE_PROVIDER_SITE_OTHER): Payer: BC Managed Care – PPO | Admitting: Family Medicine

## 2017-03-23 ENCOUNTER — Encounter: Payer: Self-pay | Admitting: Family Medicine

## 2017-03-23 VITALS — BP 132/73 | HR 66 | Wt 247.0 lb

## 2017-03-23 DIAGNOSIS — M25512 Pain in left shoulder: Secondary | ICD-10-CM

## 2017-03-23 DIAGNOSIS — S92911A Unspecified fracture of right toe(s), initial encounter for closed fracture: Secondary | ICD-10-CM

## 2017-03-23 DIAGNOSIS — S92511D Displaced fracture of proximal phalanx of right lesser toe(s), subsequent encounter for fracture with routine healing: Secondary | ICD-10-CM

## 2017-03-23 DIAGNOSIS — W010XXD Fall on same level from slipping, tripping and stumbling without subsequent striking against object, subsequent encounter: Secondary | ICD-10-CM | POA: Diagnosis not present

## 2017-03-23 NOTE — Patient Instructions (Signed)
Thank you for coming in today. Do the elastic band shoulder external rotation 15-30 reps 3x daily.  For the toe make sure to buddy tape when barefoot or with exercise.   Recheck in 2-4 weeks.  If should is not getting better we will do a shot.    Shoulder Impingement Syndrome Rehab Ask your health care provider which exercises are safe for you. Do exercises exactly as told by your health care provider and adjust them as directed. It is normal to feel mild stretching, pulling, tightness, or discomfort as you do these exercises, but you should stop right away if you feel sudden pain or your pain gets worse.Do not begin these exercises until told by your health care provider. Stretching and range of motion exercise This exercise warms up your muscles and joints and improves the movement and flexibility of your shoulder. This exercise also helps to relieve pain and stiffness. Exercise A: Passive horizontal adduction   1. Sit or stand and pull your left / right elbow across your chest, toward your other shoulder. Stop when you feel a gentle stretch in the back of your shoulder and upper arm.  Keep your arm at shoulder height.  Keep your arm as close to your body as you comfortably can. 2. Hold for __________ seconds. 3. Slowly return to the starting position. Repeat __________ times. Complete this exercise __________ times a day. Strengthening exercises These exercises build strength and endurance in your shoulder. Endurance is the ability to use your muscles for a long time, even after they get tired. Exercise B: External rotation, isometric  1. Stand or sit in a doorway, facing the door frame. 2. Bend your left / right elbow and place the back of your wrist against the door frame. Only your wrist should be touching the frame. Keep your upper arm at your side. 3. Gently press your wrist against the door frame, as if you are trying to push your arm away from your abdomen.  Avoid shrugging  your shoulder while you press your hand against the door frame. Keep your shoulder blade tucked down toward the middle of your back. 4. Hold for __________ seconds. 5. Slowly release the tension, and relax your muscles completely before you do the exercise again. Repeat __________ times. Complete this exercise __________ times a day. Exercise C: Internal rotation, isometric   1. Stand or sit in a doorway, facing the door frame. 2. Bend your left / right elbow and place the inside of your wrist against the door frame. Only your wrist should be touching the frame. Keep your upper arm at your side. 3. Gently press your wrist against the door frame, as if you are trying to push your arm toward your abdomen.  Avoid shrugging your shoulder while you press your hand against the door frame. Keep your shoulder blade tucked down toward the middle of your back. 4. Hold for __________ seconds. 5. Slowly release the tension, and relax your muscles completely before you do the exercise again. Repeat __________ times. Complete this exercise __________ times a day. Exercise D: Scapular protraction, supine   1. Lie on your back on a firm surface. Hold a __________ weight in your left / right hand. 2. Raise your left / right arm straight into the air so your hand is directly above your shoulder joint. 3. Push the weight into the air so your shoulder lifts off of the surface that you are lying on. Do not move your head, neck, or  back. 4. Hold for __________ seconds. 5. Slowly return to the starting position. Let your muscles relax completely before you repeat this exercise. Repeat __________ times. Complete this exercise __________ times a day. Exercise E: Scapular retraction   1. Sit in a stable chair without armrests, or stand. 2. Secure an exercise band to a stable object in front of you so the band is at shoulder height. 3. Hold one end of the exercise band in each hand. Your palms should face  down. 4. Squeeze your shoulder blades together and move your elbows slightly behind you. Do not shrug your shoulders while you do this. 5. Hold for __________ seconds. 6. Slowly return to the starting position. Repeat __________ times. Complete this exercise __________ times a day. Exercise F: Shoulder extension   1. Sit in a stable chair without armrests, or stand. 2. Secure an exercise band to a stable object in front of you where the band is above shoulder height. 3. Hold one end of the exercise band in each hand. 4. Straighten your elbows and lift your hands up to shoulder height. 5. Squeeze your shoulder blades together and pull your hands down to the sides of your thighs. Stop when your hands are straight down by your sides. Do not let your hands go behind your body. 6. Hold for __________ seconds. 7. Slowly return to the starting position. Repeat __________ times. Complete this exercise __________ times a day. This information is not intended to replace advice given to you by your health care provider. Make sure you discuss any questions you have with your health care provider. Document Released: 11/09/2005 Document Revised: 07/16/2016 Document Reviewed: 10/12/2015 Elsevier Interactive Patient Education  2017 [employer]   How to [employer] Buddy taping refers to taping an injured finger or toe to an uninjured finger or toe that is next to it. This protects the injured finger or toe and keeps it from moving while the injury heals. You may buddy tape a finger or toe if you have a minor sprain. Your health care provider may buddy tape your finger or toe if you have a sprain, dislocation, or fracture. You may be told to replace your buddy taping as needed. What are the risks? Generally, buddy taping is safe. However, problems may occur, such as:  Skin injury or infection.  Reduced blood flow to the finger or toe.  Skin reaction to the tape. Do not buddy tape your toe if you have  diabetes. Do not buddy tape if you know that you have an allergy to adhesives or surgical tape. How to buddy tape Before Buddy Taping  Try to reduce any pain and swelling with rest, icing, and elevation:  Avoid any activity that causes pain.  Raise (elevate) your hand or foot above the level of your heart while you are sitting or lying down.  If directed, apply ice to the injured area:  Put ice in a plastic bag.  Place a towel between your skin and the bag.  Leave the ice on for 20 minutes, 2-3 times per day. Buddy Taping Procedure   Clean and dry your finger or toe as told by your health care provider.  Place a gauze pad or a piece of cloth or cotton between your injured finger or toe and the uninjured finger or toe.  Use tape to wrap around both fingers or toes so your injured finger or toe is secured to the uninjured finger or toe.  The tape should be  snug, but not tight.  Make sure the ends of the piece of tape overlap.  Avoid placing tape directly over the joint.  Change the tape and the padding as told by your health care provider. Remove and replace the tape or padding if it becomes loose, worn, dirty, or wet. After Buddy Taping   Take over-the-counter and prescription medicines only as told by your health care provider.  Return to your normal activities as told by your health care provider. Ask your health care provider what activities are safe for you.  Watch the buddy-taped area and always remove buddy taping if:  Your pain gets worse.  Your fingers turn pale or blue.  Your skin becomes irritated. Contact a health care provider if:  You have pain, swelling, or bruising that lasts longer than three days.  You have a fever.  Your skin is red, cracked, or irritated. Get help right away if:  The injured area becomes cold, numb, or pale.  You have severe pain, swelling, bruising, or loss of movement in your finger or toe.  Your finger or toe changes shape  (deformity). This information is not intended to replace advice given to you by your health care provider. Make sure you discuss any questions you have with your health care provider. Document Released: 12/17/2004 Document Revised: 04/16/2016 Document Reviewed: 04/03/2015 Elsevier Interactive Patient Education  2017 [employer].

## 2017-03-23 NOTE — Progress Notes (Signed)
Benjamin Singleton is a 51 y.o. male who presents to [facility] today for left shoulder pain and right 4th toe pain.   Left Shoulder Pain: Starting about 3.5 weeks ago Benjamin Singleton was doing a pull down machine when it suddenly jerked his left arm up. Since then he has pain in the left shoulder especially the posterior aspect the shoulder with reaching up and reaching back. He denies any radiating pain weakness or numbness.  Right fourth toe pain: Patient was seen in February for right fourth toe fracture. He treated his foot with immobilization and buddy tape for 6 weeks but did not follow-up. He notes occasional pain and swelling. Symptoms are worse with activity and better with rest.   Past Medical History:  Diagnosis Date  . Gastritis 06/14/2015  . GERD (gastroesophageal reflux disease) 06/14/2015   No past surgical history on file. Social History  Substance Use Topics  . Smoking status: Never Smoker  . Smokeless tobacco: Never Used  . Alcohol use Yes     Comment: social     ROS:  As above   Medications: Current Outpatient Prescriptions  Medication Sig Dispense Refill  . Multiple Vitamin (MULTIVITAMIN WITH MINERALS) TABS tablet Take 1 tablet by mouth daily.    . Loratadine (CLARITIN) 10 MG CAPS Take by mouth.     No current facility-administered medications for this visit.    No Known Allergies   Exam:  BP 132/73   Pulse 66   Wt 247 lb (112 kg)   BMI 35.44 kg/m  General: Well Developed, well nourished, and in no acute distress.  Neuro/Psych: Alert and oriented x3, extra-ocular muscles intact, able to move all 4 extremities, sensation grossly intact. Skin: Warm and dry, no rashes noted.  Respiratory: Not using accessory muscles, speaking in full sentences, trachea midline.  Cardiovascular: Pulses palpable, no extremity edema. Abdomen: Does not appear distended. MSK:  Left shoulder normal-appearing. Tender to palpation along the  posterior aspect of the shoulder. Normal abduction and internal rotation. Normal external rotation range of motion. Positive empty can test and positive pain with external rotation resistance. Mildly positive Hawkin's test negative Neer's test. Negative crossover arm compression test. Patient had pain with posterior apprehension tests and relief with relocation test.  Right foot slightly swollen fourth toe. Tender palpation fourth toe. Pulses Refill and sensation intact distally.    No results found for this or any previous visit (from the past 48 hour(s)). Dg Shoulder Left  Result Date: 03/23/2017 CLINICAL DATA:  Left shoulder pain for 1 month, no known injury, initial encounter EXAM: LEFT SHOULDER - 2+ VIEW COMPARISON:  None. FINDINGS: There is no evidence of fracture or dislocation. There is no evidence of arthropathy or other focal bone abnormality. Soft tissues are unremarkable. IMPRESSION: No acute abnormality noted. Electronically Signed   By: Alcide Clever M.D.   On: 03/23/2017 09:28   Dg Toe 4th Right  Result Date: 03/23/2017 CLINICAL DATA:  Followup fourth toe fracture, subsequent encounter EXAM: RIGHT FOURTH TOE COMPARISON:  01/06/2017 FINDINGS: Persistent oblique fracture of the fourth proximal phalanx is noted. Increased sclerosis consistent with healing is seen. No new focal abnormality is noted. IMPRESSION: Fourth proximal phalangeal fracture with healing Electronically Signed   By: Alcide Clever M.D.   On: 03/23/2017 09:27      Assessment and Plan: 51 y.o. male with left shoulder pain likely due to strain or dysfunction at the rotator cuff likely infraspinatus based on pain with resisted  external rotation.  Posterior labrum may be torn as well based on exam. Discussed options. Plan for trial of conservative management with home rotator cuff eccentric exercise program. Recheck in 2-4 weeks if not better will proceed with injection likely posterior glenohumeral approach.  The  right toe remains broken but is healing based on x-ray appearance. Recommend continued buddy tape. Recheck in a few weeks.  Orders Placed This Encounter  Procedures  . DG Shoulder Left    Standing Status:   Future    Number of Occurrences:   1    Standing Expiration Date:   05/23/2018    Order Specific Question:   Reason for Exam (SYMPTOM  OR DIAGNOSIS REQUIRED)    Answer:   posterior shoulder pain following pull ups. ? sublx injury    Order Specific Question:   Preferred imaging location?    Answer:   [facility]    Order Specific Question:   Radiology Contrast Protocol - do NOT remove file path    Answer:   \\[email or web address]  . DG Toe 4th Right    Order Specific Question:   Reason for exam:    Answer:   f/u fx    Order Specific Question:   Preferred imaging location?    Answer:   [facility]    Discussed warning signs or symptoms. Please see discharge instructions. Patient expresses understanding.

## 2017-05-13 ENCOUNTER — Ambulatory Visit (INDEPENDENT_AMBULATORY_CARE_PROVIDER_SITE_OTHER): Payer: BC Managed Care – PPO | Admitting: Family Medicine

## 2017-05-13 ENCOUNTER — Ambulatory Visit (INDEPENDENT_AMBULATORY_CARE_PROVIDER_SITE_OTHER): Payer: BC Managed Care – PPO

## 2017-05-13 ENCOUNTER — Telehealth: Payer: Self-pay | Admitting: Family Medicine

## 2017-05-13 DIAGNOSIS — S92911A Unspecified fracture of right toe(s), initial encounter for closed fracture: Secondary | ICD-10-CM | POA: Diagnosis not present

## 2017-05-13 DIAGNOSIS — S92911D Unspecified fracture of right toe(s), subsequent encounter for fracture with routine healing: Secondary | ICD-10-CM

## 2017-05-13 DIAGNOSIS — W22042D Striking against wall of swimming pool causing other injury, subsequent encounter: Secondary | ICD-10-CM | POA: Diagnosis not present

## 2017-05-13 DIAGNOSIS — S92524A Nondisplaced fracture of medial phalanx of right lesser toe(s), initial encounter for closed fracture: Secondary | ICD-10-CM

## 2017-05-13 NOTE — Progress Notes (Signed)
   Benjamin BankerRafael Singleton is a 51 y.o. male who [facility] today for follow-up right toe fracture. Patient has been seen previously for fracture of his right fourth toe. He has overall is doing a lot better but notes continued swelling especially at the DIP joint of the right fourth toe. He continues to use buddy tape.   Past Medical History:  Diagnosis Date  . Gastritis 06/14/2015  . GERD (gastroesophageal reflux disease) 06/14/2015   No past surgical history on file. Social History  Substance Use Topics  . Smoking status: Never Smoker  . Smokeless tobacco: Never Used  . Alcohol use Yes     Comment: social     ROS:  As above   Medications: Current Outpatient Prescriptions  Medication Sig Dispense Refill  . Loratadine (CLARITIN) 10 MG CAPS Take by mouth.    . Multiple Vitamin (MULTIVITAMIN WITH MINERALS) TABS tablet Take 1 tablet by mouth daily.     No current facility-administered medications for this visit.    No Known Allergies   Exam:  There were no vitals taken for this visit. General: Well Developed, well nourished, and in no acute distress.  Neuro/Psych: Alert and oriented x3, extra-ocular muscles intact, able to move all 4 extremities, sensation grossly intact. Skin: Warm and dry, no rashes noted.  Respiratory: Not using accessory muscles, speaking in full sentences, trachea midline.  Cardiovascular: Pulses palpable, no extremity edema. Abdomen: Does not appear distended. MSK: Right fourth toe slightly swollen and erythematous at the DIP. Nontender with normal motion.    No results found for this or any previous visit (from the past 48 hour(s)). Dg Toe 4th Right  Result Date: 05/13/2017 CLINICAL DATA:  Followup fracture of the proximal phalanx of the right fourth toe EXAM: RIGHT FOURTH TOE COMPARISON:  Right fourth toe films of 03/23/2017 and 01/06/2017 FINDINGS: The fracture line is less visible through the  proximal phalanx of the right fourth toe indicating further interval healing. No acute abnormality is seen. Joint spaces appear normal. IMPRESSION: Further healing of the fracture through the proximal phalanx of the right fourth toe. Electronically Signed   By: Dwyane DeePaul  Barry M.D.   On: 05/13/2017 16:00      Assessment and Plan: 51 y.o. male with healing fracture. Suspect joint injury with the original fracture. Recommend continue buddy tape and watchful waiting. Return as needed.    No orders of the defined types were placed in this encounter.  No orders of the defined types were placed in this encounter.   Discussed warning signs or symptoms. Please see discharge instructions. Patient expresses understanding.

## 2017-05-13 NOTE — Patient Instructions (Signed)
Thank you for coming in today. Continue buddy tape as needed.  Recheck as needed.

## 2017-05-13 NOTE — Telephone Encounter (Signed)
Xray ordered placed.  See note from 05/13/17

## 2017-06-19 ENCOUNTER — Emergency Department
Admission: EM | Admit: 2017-06-19 | Discharge: 2017-06-19 | Disposition: A | Discharge status: Home / Self Care | Payer: BC Managed Care – PPO | Source: Home / Self Care | Attending: Family Medicine | Admitting: Family Medicine

## 2017-06-19 ENCOUNTER — Encounter: Payer: Self-pay | Admitting: Emergency Medicine

## 2017-06-19 DIAGNOSIS — R519 Headache, unspecified: Secondary | ICD-10-CM

## 2017-06-19 DIAGNOSIS — R51 Headache: Secondary | ICD-10-CM

## 2017-06-19 DIAGNOSIS — R112 Nausea with vomiting, unspecified: Secondary | ICD-10-CM

## 2017-06-19 DIAGNOSIS — R140 Abdominal distension (gaseous): Secondary | ICD-10-CM

## 2017-06-19 DIAGNOSIS — R42 Dizziness and giddiness: Secondary | ICD-10-CM

## 2017-06-19 DIAGNOSIS — R109 Unspecified abdominal pain: Secondary | ICD-10-CM

## 2017-06-19 MED ORDER — ONDANSETRON 4 MG PO TBDP: 4.0000 mg | ORAL_TABLET | Freq: Four times a day (QID) | ORAL | 0 refills | Status: DC | PRN

## 2017-06-19 MED ORDER — ONDANSETRON 4 MG PO TBDP
4.0000 mg | ORAL_TABLET | Freq: Once | ORAL | Status: AC
  Administered 2017-06-19: 4 mg via ORAL

## 2017-06-19 MED ORDER — MECLIZINE HCL 25 MG PO TABS: 25.0000 mg | ORAL_TABLET | Freq: Three times a day (TID) | ORAL | 0 refills | Status: DC | PRN

## 2017-06-19 MED ORDER — KETOROLAC TROMETHAMINE 60 MG/2ML IM SOLN
60.0000 mg | Freq: Once | INTRAMUSCULAR | Status: AC
  Administered 2017-06-19: 60 mg via INTRAMUSCULAR

## 2017-06-19 NOTE — Discharge Instructions (Signed)
°  Antivert (meclizine) is a medication to help with dizziness and nausea related to vertigo.  This medication can cause drowsiness. Do not operate heavy machinery or drive while taking.  ° °

## 2017-06-19 NOTE — ED Provider Notes (Signed)
CSN: [id number]     Arrival date & time 06/19/17  [id number] History   [id number] MD Initiated Contact with Patient 06/19/17 1013     Chief Complaint  Patient presents with  . Headache  . Nausea  . Emesis  . Bloated  . Dizziness   (Consider location/radiation/quality/duration/timing/severity/associated sxs/prior Treatment) HPI Benjamin Singleton is a 51 y.o. male presenting to UC with husband c/o abdominal bloating, nausea, vomiting, Left sided headache and dizziness that started yesterday but worsened last night and this morning.  He vomited about 4-5 times since this morning. Dizziness is described as room spinning.  Certain movements or positions worsen dizziness.  He has tried antiacid medication and gas-x type of medication yesterday without relief.  He did have chicken salad and milk last night, he had eggs with peppers and onions this morning as well as clear liquids since this morning. He does not believe symptoms were due to something he ate. Denies sick contacts or recent travel. Denies fever, chills, or diarrhea.   Past Medical History:  Diagnosis Date  . Gastritis 06/14/2015  . GERD (gastroesophageal reflux disease) 06/14/2015   History reviewed. No pertinent surgical history. Family History  Problem Relation Age of Onset  . Hypertension Mother   . Hypertension Other   . Diabetes Other   . Cancer Other    Social History  Substance Use Topics  . Smoking status: Never Smoker  . Smokeless tobacco: Never Used  . Alcohol use Yes     Comment: social    Review of Systems  Constitutional: Negative for chills and fever.  HENT: Negative for congestion and sore throat.   Respiratory: Negative for cough and shortness of breath.   Gastrointestinal: Positive for abdominal pain, nausea and vomiting. Negative for blood in stool, constipation and diarrhea.  Musculoskeletal: Negative for back pain and myalgias.  Neurological: Positive for dizziness and headaches. Negative for syncope, weakness and  light-headedness.    Allergies  Patient has no known allergies.  Home Medications   Prior to Admission medications   Medication Sig Start Date End Date Taking? Authorizing Provider  Loratadine (CLARITIN) 10 MG CAPS Take by mouth.    [provider]  meclizine (ANTIVERT) 25 MG tablet Take 1 tablet (25 mg total) by mouth 3 (three) times daily as needed for dizziness. 06/19/17   Lurene ShadowPhelps, Kyren Knick O, PA-C  Multiple Vitamin (MULTIVITAMIN WITH MINERALS) TABS tablet Take 1 tablet by mouth daily.    [provider]  ondansetron (ZOFRAN ODT) 4 MG disintegrating tablet Take 1 tablet (4 mg total) by mouth every 6 (six) hours as needed for nausea or vomiting. 4mg  ODT q4 hours prn nausea/vomit 06/19/17   Lurene ShadowPhelps, Alfreddie Consalvo O, PA-C   Meds Ordered and Administered this Visit   Medications  ondansetron (ZOFRAN-ODT) disintegrating tablet 4 mg (4 mg Oral Given 06/19/17 1015)  ketorolac (TORADOL) injection 60 mg (60 mg Intramuscular Given 06/19/17 1032)    BP (!) 142/93 (BP Location: Left Arm)   Pulse 68   Temp (!) 97.4 F (36.3 C) (Oral)   Resp 16   Ht 5\' 10"  (1.778 m)   Wt 251 lb 4 oz (114 kg)   SpO2 96%   BMI 36.05 kg/m  No data found.   Physical Exam  Constitutional: He is oriented to person, place, and time. He appears well-developed and well-nourished. No distress.  HENT:  Head: Normocephalic and atraumatic.  Right Ear: Tympanic membrane normal.  Left Ear: Tympanic membrane normal.  Nose: Nose normal.  Mouth/Throat: Uvula is midline, oropharynx is clear and moist and mucous membranes are normal.  Eyes: Pupils are equal, round, and reactive to light. Right eye exhibits nystagmus. Left eye exhibits nystagmus.  Slight horizontal nystagmus to the Left.  Neck: Normal range of motion.  Cardiovascular: Normal rate and regular rhythm.   Pulmonary/Chest: Effort normal and breath sounds normal. No respiratory distress. He has no wheezes. He has no rales.  Abdominal: Soft. He exhibits no  distension and no mass. Bowel sounds are increased. There is no tenderness. There is no rebound and no guarding.  Musculoskeletal: Normal range of motion.  Neurological: He is alert and oriented to person, place, and time. No cranial nerve deficit.  Alert to person, place and time. Speech is clear. Normal coordination. Normal gait.   Skin: Skin is warm and dry. He is not diaphoretic.  Psychiatric: He has a normal mood and affect. His behavior is normal.  Nursing note and vitals reviewed.   Urgent Care Course     Procedures (including critical care time)  Labs Review Labs Reviewed - No data to display  Imaging Review No results found.   MDM   1. Left-sided headache   2. Dizziness   3. Non-intractable vomiting with nausea, unspecified vomiting type   4. Abdominal cramping   5. Abdominal bloating    Abdominal symptoms likely viral in nature. HA and dizziness could be from recent GI sympotoms.  Abdominal exam: benign  Encouraged fluids, rest, bland diet  Rx: zofran and meclizine for likely vertigo   Encouraged f/u with PCP in 3-4 days if not improving.  Discussed symptoms that warrant emergent care in the ED.    Lurene Shadowhelps, Anokhi Shannon O, [place]PA-C 06/19/17 1422

## 2017-06-19 NOTE — ED Triage Notes (Signed)
[facility] with C/O upset stomach, nausea, vomiting, bloating, headache, dizziness, Patient states he has had dizziness this morning alert and oriented follows commands. Symptoms since yesterday.

## 2017-06-21 ENCOUNTER — Telehealth: Payer: Self-pay | Admitting: *Deleted

## 2017-06-21 NOTE — Telephone Encounter (Signed)
Called to check pt's status LM to call back if he has any questions or concerns.  

## 2017-11-10 ENCOUNTER — Encounter: Payer: Self-pay | Admitting: Family Medicine

## 2017-11-10 ENCOUNTER — Ambulatory Visit: Payer: BC Managed Care – PPO | Admitting: Family Medicine

## 2017-11-10 VITALS — BP 137/83 | HR 79 | Ht 70.0 in | Wt 252.0 lb

## 2017-11-10 DIAGNOSIS — K2900 Acute gastritis without bleeding: Secondary | ICD-10-CM | POA: Diagnosis not present

## 2017-11-10 DIAGNOSIS — Z23 Encounter for immunization: Secondary | ICD-10-CM | POA: Diagnosis not present

## 2017-11-10 MED ORDER — SUCRALFATE 1 G PO TABS: 1.0000 g | ORAL_TABLET | Freq: Four times a day (QID) | ORAL | 0 refills | Status: DC

## 2017-11-10 MED ORDER — OMEPRAZOLE 40 MG PO CPDR: 40.0000 mg | DELAYED_RELEASE_CAPSULE | Freq: Every day | ORAL | 3 refills | Status: DC

## 2017-11-10 NOTE — Patient Instructions (Signed)
Thank you for coming in today. Take omeprazole daily.  Take carafate 4x daily.  Recheck in 1 month.  Return sooner if needed.    Gastritis, Adult Gastritis is swelling (inflammation) of the stomach. When you have this condition, you can have these problems (symptoms):  Pain in your stomach.  A burning feeling in your stomach.  Feeling sick to your stomach (nauseous).  Throwing up (vomiting).  Feeling too full after you eat.  It is important to get help for this condition. Without help, your stomach can bleed, and you can get sores (ulcers) in your stomach. Follow these instructions at home:  Take over-the-counter and prescription medicines only as told by your doctor.  If you were prescribed an antibiotic medicine, take it as told by your doctor. Do not stop taking it even if you start to feel better.  Drink enough fluid to keep your pee (urine) clear or pale yellow.  Instead of eating big meals, eat small meals often. Contact a health care provider if:  Your problems get worse.  Your problems go away and then come back. Get help right away if:  You throw up blood or something that looks like coffee grounds.  You have black or dark red poop (stools).  You cannot keep fluids down.  Your stomach pain gets worse.  You have a fever.  You do not feel better after 1 week. This information is not intended to replace advice given to you by your health care provider. Make sure you discuss any questions you have with your health care provider. Document Released: 04/27/2008 Document Revised: 07/08/2016 Document Reviewed: 08/03/2015 Elsevier Interactive Patient Education  [employer]2018 Elsevier Inc.

## 2017-11-11 DIAGNOSIS — Z23 Encounter for immunization: Secondary | ICD-10-CM | POA: Diagnosis not present

## 2017-11-11 LAB — CBC WITH DIFFERENTIAL/PLATELET
BASOS ABS: 49 {cells}/uL (ref 0–200)
Basophils Relative: 0.6 %
EOS ABS: 82 {cells}/uL (ref 15–500)
Eosinophils Relative: 1 %
HEMATOCRIT: 43.6 % (ref 38.5–50.0)
HEMOGLOBIN: 15 g/dL (ref 13.2–17.1)
Lymphs Abs: 2001 cells/uL (ref 850–3900)
MCH: 29.4 pg (ref 27.0–33.0)
MCHC: 34.4 g/dL (ref 32.0–36.0)
MCV: 85.5 fL (ref 80.0–100.0)
MONOS PCT: 10.4 %
MPV: 10.7 fL (ref 7.5–12.5)
NEUTROS ABS: 5215 {cells}/uL (ref 1500–7800)
Neutrophils Relative %: 63.6 %
Platelets: 288 10*3/uL (ref 140–400)
RBC: 5.1 10*6/uL (ref 4.20–5.80)
RDW: 12.8 % (ref 11.0–15.0)
Total Lymphocyte: 24.4 %
WBC mixed population: 853 cells/uL (ref 200–950)
WBC: 8.2 10*3/uL (ref 3.8–10.8)

## 2017-11-11 LAB — COMPLETE METABOLIC PANEL WITH GFR
AG Ratio: 2 (calc) (ref 1.0–2.5)
ALBUMIN MSPROF: 4.5 g/dL (ref 3.6–5.1)
ALT: 28 U/L (ref 9–46)
AST: 21 U/L (ref 10–35)
Alkaline phosphatase (APISO): 73 U/L (ref 40–115)
BUN: 22 mg/dL (ref 7–25)
CALCIUM: 9.5 mg/dL (ref 8.6–10.3)
CO2: 27 mmol/L (ref 20–32)
CREATININE: 0.92 mg/dL (ref 0.70–1.33)
Chloride: 103 mmol/L (ref 98–110)
GFR, EST AFRICAN AMERICAN: 110 mL/min/{1.73_m2} (ref >=60)
GFR, EST NON AFRICAN AMERICAN: 95 mL/min/{1.73_m2} (ref >=60)
GLOBULIN: 2.3 g/dL (ref 1.9–3.7)
Glucose, Bld: 95 mg/dL (ref 65–99)
Potassium: 4.1 mmol/L (ref 3.5–5.3)
SODIUM: 138 mmol/L (ref 135–146)
TOTAL PROTEIN: 6.8 g/dL (ref 6.1–8.1)
Total Bilirubin: 0.6 mg/dL (ref 0.2–1.2)

## 2017-11-11 LAB — H. PYLORI BREATH TEST: H. PYLORI BREATH TEST: NOT DETECTED

## 2017-11-11 LAB — LIPASE: Lipase: 22 U/L (ref 7–60)

## 2017-11-11 NOTE — Addendum Note (Signed)
Addended by: Pixie CasinoUNNINGHAM, RHONDA C on: 11/11/2017 09:09 AM   Modules accepted: Orders

## 2017-11-11 NOTE — Progress Notes (Signed)
Benjamin Singleton is a 51 y.o. male who presents to [facility] today for left upper quadrant abdominal pain.  Benjamin Singleton notes a week long history of pain in the left upper quadrant.  Benjamin Singleton has had gastritis before notes that his current symptoms are consistent with gastritis.  Benjamin Singleton has tried some Zantac over-the-counter which has helped.  Benjamin Singleton denies any fevers or chills or vomiting or diarrhea.  Benjamin Singleton denies any blood in the stool.   Past Medical History:  Diagnosis Date  . Gastritis 06/14/2015  . GERD (gastroesophageal reflux disease) 06/14/2015   No past surgical history on file. Social History   Tobacco Use  . Smoking status: Never Smoker  . Smokeless tobacco: Never Used  Substance Use Topics  . Alcohol use: Yes    Comment: social   family history includes Cancer in his other; Diabetes in his other; Hypertension in his mother and other.  ROS as above:  Medications: Current Outpatient Medications  Medication Sig Dispense Refill  . Loratadine (CLARITIN) 10 MG CAPS Take by mouth.    . meclizine (ANTIVERT) 25 MG tablet Take 1 tablet (25 mg total) by mouth 3 (three) times daily as needed for dizziness. 30 tablet 0  . Multiple Vitamin (MULTIVITAMIN WITH MINERALS) TABS tablet Take 1 tablet by mouth daily.    . ondansetron (ZOFRAN ODT) 4 MG disintegrating tablet Take 1 tablet (4 mg total) by mouth every 6 (six) hours as needed for nausea or vomiting. 4mg  ODT q4 hours prn nausea/vomit 12 tablet 0  . omeprazole (PRILOSEC) 40 MG capsule Take 1 capsule (40 mg total) by mouth daily. 30 capsule 3  . sucralfate (CARAFATE) 1 g tablet Take 1 tablet (1 g total) by mouth 4 (four) times daily. 120 tablet 0   No current facility-administered medications for this visit.    No Known Allergies  Health Maintenance Health Maintenance  Topic Date Due  . Hepatitis C Screening  [birth year]  . HIV  Screening  11/14/1979  . TETANUS/TDAP  11/14/1983  . COLONOSCOPY  11/13/2014  . INFLUENZA VACCINE  06/23/2017     Exam:  BP 137/83   Pulse 79   Ht 5\' 10"  (1.778 m)   Wt 252 lb (114.3 kg)   BMI 36.16 kg/m  Gen: Well NAD HEENT: EOMI,  MMM Lungs: Normal work of breathing. CTABL Heart: RRR no MRG Abd: NABS, Soft. Nondistended, Nontender Exts: Brisk capillary refill, warm and well perfused.     No results found.    Assessment and Plan: 51 y.o. male with gastritis likely cause of abdominal pain.  Plan for H. pylori breath test as well as laboratory workup listed below.  Plan for empiric treatment with omeprazole and Carafate.  Recheck as needed.  Orders Placed This Encounter  Procedures  . CBC with Differential/Platelet  . COMPLETE METABOLIC PANEL WITH GFR  . Lipase  . H. pylori breath test   Meds ordered this encounter  Medications  . omeprazole (PRILOSEC) 40 MG capsule    Sig: Take 1 capsule (40 mg total) by mouth daily.    Dispense:  30 capsule    Refill:  3  . sucralfate (CARAFATE) 1 g tablet    Sig: Take 1 tablet (1 g total) by mouth 4 (four) times daily.    Dispense:  120 tablet    Refill:  0     Discussed warning signs or symptoms. Please see discharge instructions. Patient expresses understanding.

## 2018-06-06 ENCOUNTER — Encounter: Payer: Self-pay | Admitting: Family Medicine

## 2018-06-06 ENCOUNTER — Ambulatory Visit: Payer: BC Managed Care – PPO | Admitting: Family Medicine

## 2018-06-06 VITALS — BP 134/76 | HR 67 | Ht 70.0 in | Wt 216.0 lb

## 2018-06-06 DIAGNOSIS — J0100 Acute maxillary sinusitis, unspecified: Secondary | ICD-10-CM | POA: Diagnosis not present

## 2018-06-06 MED ORDER — IPRATROPIUM BROMIDE 0.06 % NA SOLN: 2.0000 | NASAL | 6 refills | Status: DC | PRN

## 2018-06-06 MED ORDER — AMOXICILLIN-POT CLAVULANATE 875-125 MG PO TABS: 1.0000 | ORAL_TABLET | Freq: Two times a day (BID) | ORAL | 0 refills | Status: AC

## 2018-06-06 NOTE — Progress Notes (Signed)
Benjamin Singleton is a 52 y.o. male who presents to [facility]: [facility] today for sinus congestion   He has a long history of nasal and sinus congestion that usually is mild and resolves with ipratropium spray. He has had a 3 day history of severe congestion, sinus pressure and headaches.  Symptoms are worsening he has taken his ipratropium which has not helped. He has also tried saline nasal spray which provided minimal relief. He denies fever/chills, denies cough or other associated symptoms.   Of note, he has a dental appointment on Thursday and he has been taking amoxicillin 500 mg TID in preparation for that appointment.    ROS as above:  Exam:  BP 134/76   Pulse 67   Ht 5\' 10"  (1.778 m)   Wt 216 lb (98 kg)   BMI 30.99 kg/m  Gen: Well NAD HEENT: EOMI,  MMM, neck supple with no lymphadenopathy.  Inflamed nasal turbinates bilaterally.  Moderate tender palpation maxillary sinus.  Normal tympanic membranes.  Normal posterior pharynx Lungs: Normal work of breathing. CTABL Heart: RRR no MRG Abd: NABS, Soft. Nondistended, Nontender Exts: Brisk capillary refill, warm and well perfused.   Lab and Radiology Results No results found for this or any previous visit (from the past 72 hour(s)). No results found.    Assessment and Plan: 52 y.o. male with sinus congestion. Likely sinus infection vs allergies. His congestion has lasted for 3 days without relief from ipratropium spray, saline spray, or amoxicillin 500 mg TID. The amoxicillin is sufficient for dental preparation but is low for a sinus infection. The plan will be to switch his antibiotic to Augmentin 875-125 mg BID, to add Claritin, and to add Flonase, and to continue the ipratropium.   Recheck in the near future for well adult visit.   Patient declined Tdap vaccine today.  No orders of the defined types were placed in  this encounter.  Meds ordered this encounter  Medications  . amoxicillin-clavulanate (AUGMENTIN) 875-125 MG tablet    Sig: Take 1 tablet by mouth 2 (two) times daily for 10 days.    Dispense:  20 tablet    Refill:  0  . ipratropium (ATROVENT) 0.06 % nasal spray    Sig: Place 2 sprays into both nostrils every 4 (four) hours as needed for rhinitis.    Dispense:  10 mL    Refill:  6     Historical information moved to improve visibility of documentation.  Past Medical History:  Diagnosis Date  . Gastritis 06/14/2015  . GERD (gastroesophageal reflux disease) 06/14/2015   No past surgical history on file. Social History   Tobacco Use  . Smoking status: Never Smoker  . Smokeless tobacco: Never Used  Substance Use Topics  . Alcohol use: Yes    Comment: social   family history includes Cancer in his other; Diabetes in his other; Hypertension in his mother and other.  Medications: Current Outpatient Medications  Medication Sig Dispense Refill  . Loratadine (CLARITIN) 10 MG CAPS Take by mouth.    . meclizine (ANTIVERT) 25 MG tablet Take 1 tablet (25 mg total) by mouth 3 (three) times daily as needed for dizziness. 30 tablet 0  . Multiple Vitamin (MULTIVITAMIN WITH MINERALS) TABS tablet Take 1 tablet by mouth daily.    Marland Kitchen omeprazole (PRILOSEC) 40 MG capsule Take 1 capsule (40 mg total) by mouth daily. 30 capsule 3  . ondansetron (ZOFRAN ODT) 4 MG  disintegrating tablet Take 1 tablet (4 mg total) by mouth every 6 (six) hours as needed for nausea or vomiting. 4mg  ODT q4 hours prn nausea/vomit 12 tablet 0  . sucralfate (CARAFATE) 1 g tablet Take 1 tablet (1 g total) by mouth 4 (four) times daily. 120 tablet 0  . amoxicillin-clavulanate (AUGMENTIN) 875-125 MG tablet Take 1 tablet by mouth 2 (two) times daily for 10 days. 20 tablet 0  . ipratropium (ATROVENT) 0.06 % nasal spray Place 2 sprays into both nostrils every 4 (four) hours as needed for rhinitis. 10 mL 6   No current  facility-administered medications for this visit.    No Known Allergies   Discussed warning signs or symptoms. Please see discharge instructions. Patient expresses understanding.  I personally was present and performed or re-performed the history, physical exam and medical decision-making activities of this service and have verified that the service and findings are accurately documented in the student's note. ___________________________________________ Clementeen GrahamEvan Cobi Aldape M.D., ABFM., CAQSM. Primary Care and Sports Medicine Adjunct Instructor of Family Medicine  [employer]

## 2018-06-06 NOTE — Patient Instructions (Signed)
Thank you for coming in today. Take Augmentin antibiotic STOP amoxicillin Continue flonase and claritin Let me know if not better.    Sinusitis, Adult Sinusitis is soreness and inflammation of your sinuses. Sinuses are hollow spaces in the bones around your face. Your sinuses are located:  Around your eyes.  In the middle of your forehead.  Behind your nose.  In your cheekbones.  Your sinuses and nasal passages are lined with a stringy fluid (mucus). Mucus normally drains out of your sinuses. When your nasal tissues become inflamed or swollen, the mucus can become trapped or blocked so air cannot flow through your sinuses. This allows bacteria, viruses, and funguses to grow, which leads to infection. Sinusitis can develop quickly and last for 7?10 days (acute) or for more than 12 weeks (chronic). Sinusitis often develops after a cold. What are the causes? This condition is caused by anything that creates swelling in the sinuses or stops mucus from draining, including:  Allergies.  Asthma.  Bacterial or viral infection.  Abnormally shaped bones between the nasal passages.  Nasal growths that contain mucus (nasal polyps).  Narrow sinus openings.  Pollutants, such as chemicals or irritants in the air.  A foreign object stuck in the nose.  A fungal infection. This is rare.  What increases the risk? The following factors may make you more likely to develop this condition:  Having allergies or asthma.  Having had a recent cold or respiratory tract infection.  Having structural deformities or blockages in your nose or sinuses.  Having a weak immune system.  Doing a lot of swimming or diving.  Overusing nasal sprays.  Smoking.  What are the signs or symptoms? The main symptoms of this condition are pain and a feeling of pressure around the affected sinuses. Other symptoms include:  Upper toothache.  Earache.  Headache.  Bad breath.  Decreased sense of  smell and taste.  A cough that may get worse at night.  Fatigue.  Fever.  Thick drainage from your nose. The drainage is often green and it may contain pus (purulent).  Stuffy nose or congestion.  Postnasal drip. This is when extra mucus collects in the throat or back of the nose.  Swelling and warmth over the affected sinuses.  Sore throat.  Sensitivity to light.  How is this diagnosed? This condition is diagnosed based on symptoms, a medical history, and a physical exam. To find out if your condition is acute or chronic, your health care provider may:  Look in your nose for signs of nasal polyps.  Tap over the affected sinus to check for signs of infection.  View the inside of your sinuses using an imaging device that has a light attached (endoscope).  If your health care provider suspects that you have chronic sinusitis, you may also:  Be tested for allergies.  Have a sample of mucus taken from your nose (nasal culture) and checked for bacteria.  Have a mucus sample examined to see if your sinusitis is related to an allergy.  If your sinusitis does not respond to treatment and it lasts longer than 8 weeks, you may have an MRI or CT scan to check your sinuses. These scans also help to determine how severe your infection is. In rare cases, a bone biopsy may be done to rule out more serious types of fungal sinus disease. How is this treated? Treatment for sinusitis depends on the cause and whether your condition is chronic or acute. If a virus  is causing your sinusitis, your symptoms will go away on their own within 10 days. You may be given medicines to relieve your symptoms, including:  Topical nasal decongestants. They shrink swollen nasal passages and let mucus drain from your sinuses.  Antihistamines. These drugs block inflammation that is triggered by allergies. This can help to ease swelling in your nose and sinuses.  Topical nasal corticosteroids. These are nasal  sprays that ease inflammation and swelling in your nose and sinuses.  Nasal saline washes. These rinses can help to get rid of thick mucus in your nose.  If your condition is caused by bacteria, you will be given an antibiotic medicine. If your condition is caused by a fungus, you will be given an antifungal medicine. Surgery may be needed to correct underlying conditions, such as narrow nasal passages. Surgery may also be needed to remove polyps. Follow these instructions at home: Medicines  Take, use, or apply over-the-counter and prescription medicines only as told by your health care provider. These may include nasal sprays.  If you were prescribed an antibiotic medicine, take it as told by your health care provider. Do not stop taking the antibiotic even if you start to feel better. Hydrate and Humidify  Drink enough water to keep your urine clear or pale yellow. Staying hydrated will help to thin your mucus.  Use a cool mist humidifier to keep the humidity level in your home above 50%.  Inhale steam for 10-15 minutes, 3-4 times a day or as told by your health care provider. You can do this in the bathroom while a hot shower is running.  Limit your exposure to cool or dry air. Rest  Rest as much as possible.  Sleep with your head raised (elevated).  Make sure to get enough sleep each night. General instructions  Apply a warm, moist washcloth to your face 3-4 times a day or as told by your health care provider. This will help with discomfort.  Wash your hands often with soap and water to reduce your exposure to viruses and other germs. If soap and water are not available, use hand sanitizer.  Do not smoke. Avoid being around people who are smoking (secondhand smoke).  Keep all follow-up visits as told by your health care provider. This is important. Contact a health care provider if:  You have a fever.  Your symptoms get worse.  Your symptoms do not improve within 10  days. Get help right away if:  You have a severe headache.  You have persistent vomiting.  You have pain or swelling around your face or eyes.  You have vision problems.  You develop confusion.  Your neck is stiff.  You have trouble breathing. This information is not intended to replace advice given to you by your health care provider. Make sure you discuss any questions you have with your health care provider. Document Released: 11/09/2005 Document Revised: 07/05/2016 Document Reviewed: 09/04/2015 Elsevier Interactive Patient Education  [employer]2018 [employer]

## 2018-10-17 ENCOUNTER — Ambulatory Visit: Payer: BC Managed Care – PPO | Admitting: Family Medicine

## 2018-10-31 ENCOUNTER — Ambulatory Visit (INDEPENDENT_AMBULATORY_CARE_PROVIDER_SITE_OTHER): Payer: Self-pay | Admitting: Family Medicine

## 2018-10-31 ENCOUNTER — Ambulatory Visit (INDEPENDENT_AMBULATORY_CARE_PROVIDER_SITE_OTHER): Payer: BC Managed Care – PPO | Admitting: Family Medicine

## 2018-10-31 ENCOUNTER — Encounter: Payer: Self-pay | Admitting: Family Medicine

## 2018-10-31 VITALS — BP 152/91 | HR 79 | Wt 261.0 lb

## 2018-10-31 DIAGNOSIS — Z23 Encounter for immunization: Secondary | ICD-10-CM | POA: Diagnosis not present

## 2018-10-31 DIAGNOSIS — M542 Cervicalgia: Secondary | ICD-10-CM

## 2018-10-31 DIAGNOSIS — M545 Low back pain, unspecified: Secondary | ICD-10-CM

## 2018-10-31 DIAGNOSIS — I10 Essential (primary) hypertension: Secondary | ICD-10-CM | POA: Diagnosis not present

## 2018-10-31 DIAGNOSIS — M25561 Pain in right knee: Secondary | ICD-10-CM | POA: Diagnosis not present

## 2018-10-31 DIAGNOSIS — L739 Follicular disorder, unspecified: Secondary | ICD-10-CM | POA: Diagnosis not present

## 2018-10-31 MED ORDER — DICLOFENAC SODIUM 1 % TD GEL: 4.0000 g | Freq: Four times a day (QID) | TRANSDERMAL | 11 refills | Status: DC

## 2018-10-31 MED ORDER — MUPIROCIN 2 % EX OINT
TOPICAL_OINTMENT | CUTANEOUS | 3 refills | Status: DC
Start: 2018-10-31 — End: 2018-12-19

## 2018-10-31 NOTE — Patient Instructions (Signed)
Thank you for coming in today. For the low back and neck if not better on recheck next step is physical therapy.  Keep me updated.  OK to take tylenol or ibuprofen for pain as needed.
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
## 2018-10-31 NOTE — Progress Notes (Signed)
Benjamin Singleton is a 52 y.o. male who presents to [facility]: [facility] today for leg wound, hypertension, and knee pain.  Benjamin Singleton developed a sore on his right calf about 2 weeks ago.  He has been treated with over-the-counter antibiotic ointment and notes it has helped a bit.  He notes that not painful or not itchy.  He denies any discharge.  He thinks it is improving.  Additionally he notes knee pain.  He notes pain bilateral knees right worse than left.  Symptoms present for about 2 months without injury.  Pain is worse with activity and better with rest.  He denies any catching locking or giving way.  Additionally he notes his blood pressure is elevated today but typically better controlled at home.  He measures his blood pressures typically in the 120s over 70s.  No chest pain palpitations shortness of breath lightheadedness or dizziness.   ROS as above:  Exam:  BP (!) 152/91   Pulse 79   Wt 261 lb (118.4 kg)   BMI 37.45 kg/m  Wt Readings from Last 5 Encounters:  10/31/18 261 lb (118.4 kg)  06/06/18 216 lb (98 kg)  11/10/17 252 lb (114.3 kg)  06/19/17 251 lb 4 oz (114 kg)  03/23/17 247 lb (112 kg)    Gen: Well NAD HEENT: EOMI,  MMM Lungs: Normal work of breathing. CTABL Heart: RRR no MRG Abd: NABS, Soft. Nondistended, Nontender Exts: Brisk capillary refill, warm and well perfused.  Right knee normal-appearing normal motion nontender stable ligamentous exam intact extension and flexion strength. Left knee normal-appearing nontender normal motion stable ligaments exam intact strength.  Right calf: Slight ulceration nontender no discharge.  No surrounding erythema or induration.  Lab and Radiology Results No results found for this or any previous visit (from the past 72 hour(s)). No results found.    Assessment and Plan: 52 y.o. male with  Right leg wound:  Likely healing folliculitis.  Treat with mupirocin antibiotic ointment and watchful waiting.  Recheck in a few weeks if not improved.  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  Plan for home exercise program and diclofenac gel.  Recheck if not improving.  Hypertension: Blood pressure elevated today but typically better controlled at home.  Watchful waiting recheck in about 3 to 4 weeks.  Tdap and flu vaccine given today prior to discharge.   Orders Placed This Encounter  Procedures  . Flu Vaccine QUAD 6+ mos PF IM (Fluarix Quad PF)  . Tdap vaccine greater than or equal to 7yo IM   No orders of the defined types were placed in this encounter.    Historical information moved to improve visibility of documentation.  Past Medical History:  Diagnosis Date  . Gastritis 06/14/2015  . GERD (gastroesophageal reflux disease) 06/14/2015   No past surgical history on file. Social History   Tobacco Use  . Smoking status: Never Smoker  . Smokeless tobacco: Never Used  Substance Use Topics  . Alcohol use: Yes    Comment: social   family history includes Cancer in his other; Diabetes in his other; Hypertension in his mother and other.  Medications: Current Outpatient Medications  Medication Sig Dispense Refill  . ipratropium (ATROVENT) 0.06 % nasal spray Place 2 sprays into both nostrils every 4 (four) hours as needed for rhinitis. 10 mL 6  . Loratadine (CLARITIN) 10 MG CAPS Take by mouth.    . meclizine (ANTIVERT) 25 MG tablet Take 1  tablet (25 mg total) by mouth 3 (three) times daily as needed for dizziness. 30 tablet 0  . Multiple Vitamin (MULTIVITAMIN WITH MINERALS) TABS tablet Take 1 tablet by mouth daily.    Marland Kitchen. omeprazole (PRILOSEC) 40 MG capsule Take 1 capsule (40 mg total) by mouth daily. 30 capsule 3  . ondansetron (ZOFRAN ODT) 4 MG disintegrating tablet Take 1 tablet (4 mg total) by mouth every 6 (six) hours as needed for nausea or vomiting. 4mg  ODT q4 hours prn  nausea/vomit 12 tablet 0  . sucralfate (CARAFATE) 1 g tablet Take 1 tablet (1 g total) by mouth 4 (four) times daily. 120 tablet 0   No current facility-administered medications for this visit.    No Known Allergies   Discussed warning signs or symptoms. Please see discharge instructions. Patient expresses understanding.

## 2018-10-31 NOTE — Patient Instructions (Signed)
Thank you for coming in today. For the wound on the leg apply the mupericine cream 2x daily for 1 week.  For the knees apply the diclofeac gel 4x daily for as needed for pain  Keep track of blood pressure.   Recheck when you get back in January. .Marland Kitchen

## 2018-11-02 ENCOUNTER — Telehealth: Payer: Self-pay | Admitting: Family Medicine

## 2018-11-02 NOTE — Telephone Encounter (Signed)
PT advised the prescribed medication diclofenac sodium (VOLTAREN) 1 % GEL is not covered by his insurance.  Mr. Benjamin Singleton asked, "If there was a similar medication that would be covered by insurance".

## 2018-11-04 NOTE — Telephone Encounter (Signed)
Go to www.goodrx.com you should be able to get a coupon to make it cheaper.

## 2018-11-04 NOTE — Telephone Encounter (Signed)
Left VM with recommendation  

## 2018-12-19 ENCOUNTER — Ambulatory Visit (INDEPENDENT_AMBULATORY_CARE_PROVIDER_SITE_OTHER): Payer: BC Managed Care – PPO | Admitting: Family Medicine

## 2018-12-19 ENCOUNTER — Encounter: Payer: Self-pay | Admitting: Family Medicine

## 2018-12-19 ENCOUNTER — Ambulatory Visit (INDEPENDENT_AMBULATORY_CARE_PROVIDER_SITE_OTHER): Payer: BC Managed Care – PPO

## 2018-12-19 VITALS — BP 145/79 | HR 74 | Ht 70.0 in | Wt 262.0 lb

## 2018-12-19 DIAGNOSIS — R05 Cough: Secondary | ICD-10-CM

## 2018-12-19 DIAGNOSIS — M25561 Pain in right knee: Secondary | ICD-10-CM

## 2018-12-19 DIAGNOSIS — S81801A Unspecified open wound, right lower leg, initial encounter: Secondary | ICD-10-CM

## 2018-12-19 DIAGNOSIS — M170 Bilateral primary osteoarthritis of knee: Secondary | ICD-10-CM

## 2018-12-19 DIAGNOSIS — R059 Cough, unspecified: Secondary | ICD-10-CM

## 2018-12-19 MED ORDER — DICLOFENAC SODIUM 1 % TD GEL: 4.0000 g | Freq: Four times a day (QID) | TRANSDERMAL | 11 refills | Status: DC

## 2018-12-19 MED ORDER — IPRATROPIUM BROMIDE 0.06 % NA SOLN: 2.0000 | NASAL | 6 refills | Status: DC | PRN

## 2018-12-19 MED ORDER — BENZONATATE 200 MG PO CAPS: 200.0000 mg | ORAL_CAPSULE | Freq: Three times a day (TID) | ORAL | 0 refills | Status: DC | PRN

## 2018-12-19 MED ORDER — TRIAMCINOLONE ACETONIDE 0.1 % EX OINT: 1.0000 "application " | TOPICAL_OINTMENT | Freq: Two times a day (BID) | CUTANEOUS | 6 refills | Status: DC

## 2018-12-19 NOTE — Patient Instructions (Signed)
Thank you for coming in today. Use the gel on the knee 4x daily for pain as needed.  If not better next step is injection.   For wound use triamcinolone ointment on the wound.  If not better will do skin biopsy.   For cough and runny nose use the atrovent nasal spray and tessalon pearles.    For exercises avoid deep knee bends, lunges, squats.  OK to bike, swimming, elliptical .    Recheck in 1 month.

## 2018-12-19 NOTE — Progress Notes (Signed)
Benjamin Singleton is a 53 y.o. male who presents to [facility] today for cough and follow-up on right knee pain and left leg wounds.   Cough: Mr. Benjamin Singleton developed a dry cough in December while he was in [place] for 22 days. He went to the doctor there. He reports that a CXR ruled out pneumonia and that he did not have the flu. He started taking OTC cough medicine. His dry cough has gradually improved, but is not completely better. He thinks this cough may be related to his allergies. Having trouble sleeping due to the cough. Would like a refill of Atrovent nasal spray as this has helped his cough in the past. Denies fevers, chills, body aches, runny nose, and N/V.   Right knee pain: Mr. Benjamin Singleton has had right knee pain since about October. He was unable to get the prescribed Diclofenac gel b/c his insurance wouldn't cover it. His pain has not improved or worsen since he was seen in December. The pain is worse medial to the knee. Pain worse when twisting, turning, or getting into car. Also painful to walk. Ibuprofen and ice relieve pain only temporarily.   Right leg wound: Mr. Benjamin Singleton has had a sore on his right calf since mid-November. He used the prescribed Mupirocin ointment for the month of December. The wound has not changed or decreased in size. Slightly erythematous.     ROS:  As above  Exam:  BP (!) 145/79   Pulse 74   Ht 5\' 10"  (1.778 m)   Wt 262 lb (118.8 kg)   BMI 37.59 kg/m  Wt Readings from Last 5 Encounters:  12/19/18 262 lb (118.8 kg)  10/31/18 261 lb (118.4 kg)  06/06/18 216 lb (98 kg)  11/10/17 252 lb (114.3 kg)  06/19/17 251 lb 4 oz (114 kg)   General: Well Developed, well nourished, and in no acute distress.  Neuro/Psych: Alert and oriented x3, extra-ocular muscles intact, able to move all 4 extremities, sensation grossly intact. Skin: Warm and dry, no rashes noted.  Shallow ulceration on medial aspect of right calf: Small round  wound 2 mm in diameter. Slightly erythematous. Some scabbing prese Respiratory: Not using accessory muscles, speaking in full sentences, trachea midline.  Cardiovascular: Pulses palpable, no extremity edema. Abdomen: Does not appear distended. MSK:  Right knee: Normal appearance. No erythema, edema, or rashes.  Tender to palpation over the medial joint line.  Normal ROM. Pain reproduced with passive extension.   Strength 5/5 with resisted flexion and extension. Pain with resisted extension. Positive McMurray test.  Negative varus and valgus stress test. Pulses and capillary refill intact distally lower extremity.   Lab and Radiology Results No results found for this or any previous visit (from the past 72 hour(s)). Dg Knee 1-2 Views Left  Result Date: 12/19/2018 CLINICAL DATA:  53 year old male with right leg pain off and on for the past year. No known injury. Initial encounter. EXAM: LEFT KNEE - 1-2 VIEW; RIGHT KNEE - COMPLETE 4+ VIEW COMPARISON:  None. FINDINGS: Right knee four views: Mild medial tibiofemoral joint space narrowing. Minimal spurring posterior aspect of the patella may represent minimal patellofemoral joint degenerative changes. No fracture or dislocation. No evidence of joint effusion. Left knee two views: Mild medial tibiofemoral joint space narrowing. IMPRESSION: 1. Mild bilateral medial tibiofemoral joint space narrowing. 2. Minimal spurring posterior aspect right patella may represent minimal right patellofemoral joint degenerative changes. Electronically Signed   By: Ernie Hew.D.  On: 12/19/2018 19:01   Dg Knee Complete 4 Views Right  Result Date: 12/19/2018 CLINICAL DATA:  53 year old male with right leg pain off and on for the past year. No known injury. Initial encounter. EXAM: LEFT KNEE - 1-2 VIEW; RIGHT KNEE - COMPLETE 4+ VIEW COMPARISON:  None. FINDINGS: Right knee four views: Mild medial tibiofemoral joint space narrowing. Minimal spurring posterior aspect  of the patella may represent minimal patellofemoral joint degenerative changes. No fracture or dislocation. No evidence of joint effusion. Left knee two views: Mild medial tibiofemoral joint space narrowing. IMPRESSION: 1. Mild bilateral medial tibiofemoral joint space narrowing. 2. Minimal spurring posterior aspect right patella may represent minimal right patellofemoral joint degenerative changes. Electronically Signed   By: Lacy Duverney M.D.   On: 12/19/2018 19:01  I personally (independently) visualized and performed the interpretation of the images attached in this note.      Assessment and Plan: 53 y.o. male with  Right knee pain: Concern for degenerative changes versus meniscus injury.  Discussed options.  Plan for diclofenac gel as patient was not able to access diclofenac gel previously.  If not improving next that would be steroid injection trial and then onto MRI if still not better.  He would like to avoid surgery or injections if possible therefore continue conservative management.  Right calf ulceration: Shallow wound not fully healing.  Unclear etiology.  Plan for short course trial of triamcinolone.  If not better next up would be skin biopsy.  Good pulses present in distal extremity therefore vascular disease unlikely.  However if etiology remains unclear we will proceed with further vascular work-up.  Cough: Mr. Benjamin Singleton dry cough has been gradually improving.  Plan to proceed with trial of symptomatic management with Atrovent nasal spray and Tessalon Perles.    Orders Placed This Encounter  Procedures  . DG Knee Complete 4 Views Right    Please include patellar sunrise, lateral, and weightbearing bilateral AP and bilateral rosenberg views    Standing Status:   Future    Number of Occurrences:   1    Standing Expiration Date:   02/18/2020    Order Specific Question:   Reason for exam:    Answer:   Please include patellar sunrise, lateral, and weightbearing bilateral AP and  bilateral rosenberg views    Comments:   Please include patellar sunrise, lateral, and weightbearing bilateral AP and bilateral rosenberg views    Order Specific Question:   Preferred imaging location?    Answer:   [facility]  . DG Knee 1-2 Views Left    Standing Status:   Future    Number of Occurrences:   1    Standing Expiration Date:   02/19/2020    Order Specific Question:   Reason for Exam (SYMPTOM  OR DIAGNOSIS REQUIRED)    Answer:   For use with right knee x-ray, bilateral AP and Rosenberg standing.    Order Specific Question:   Preferred imaging location?    Answer:   [facility]   Meds ordered this encounter  Medications  . ipratropium (ATROVENT) 0.06 % nasal spray    Sig: Place 2 sprays into both nostrils every 4 (four) hours as needed for rhinitis.    Dispense:  10 mL    Refill:  6  . triamcinolone ointment (KENALOG) 0.1 %    Sig: Apply 1 application topically 2 (two) times daily. To affected areas    Dispense:  60 g    Refill:  6  .  diclofenac sodium (VOLTAREN) 1 % GEL    Sig: Apply 4 g topically 4 (four) times daily. To affected joint.for pain    Dispense:  100 g    Refill:  11    Knee OA., Tried and failed ibuprofen and aleve  . benzonatate (TESSALON) 200 MG capsule    Sig: Take 1 capsule (200 mg total) by mouth 3 (three) times daily as needed for cough.    Dispense:  45 capsule    Refill:  0    Historical information moved to improve visibility of documentation.  Past Medical History:  Diagnosis Date  . Gastritis 06/14/2015  . GERD (gastroesophageal reflux disease) 06/14/2015   No past surgical history on file. Social History   Tobacco Use  . Smoking status: Never Smoker  . Smokeless tobacco: Never Used  Substance Use Topics  . Alcohol use: Yes    Comment: social   family history includes Cancer in an other family member; Diabetes in an other family member; Hypertension in his mother and another family  member.  Medications: Current Outpatient Medications  Medication Sig Dispense Refill  . diclofenac sodium (VOLTAREN) 1 % GEL Apply 4 g topically 4 (four) times daily. To affected joint.for pain 100 g 11  . ipratropium (ATROVENT) 0.06 % nasal spray Place 2 sprays into both nostrils every 4 (four) hours as needed for rhinitis. 10 mL 6  . Loratadine (CLARITIN) 10 MG CAPS Take by mouth.    . meclizine (ANTIVERT) 25 MG tablet Take 1 tablet (25 mg total) by mouth 3 (three) times daily as needed for dizziness. 30 tablet 0  . Multiple Vitamin (MULTIVITAMIN WITH MINERALS) TABS tablet Take 1 tablet by mouth daily.    Marland Kitchen. omeprazole (PRILOSEC) 40 MG capsule Take 1 capsule (40 mg total) by mouth daily. 30 capsule 3  . ondansetron (ZOFRAN ODT) 4 MG disintegrating tablet Take 1 tablet (4 mg total) by mouth every 6 (six) hours as needed for nausea or vomiting. 4mg  ODT q4 hours prn nausea/vomit 12 tablet 0  . sucralfate (CARAFATE) 1 g tablet Take 1 tablet (1 g total) by mouth 4 (four) times daily. 120 tablet 0  . benzonatate (TESSALON) 200 MG capsule Take 1 capsule (200 mg total) by mouth 3 (three) times daily as needed for cough. 45 capsule 0  . triamcinolone ointment (KENALOG) 0.1 % Apply 1 application topically 2 (two) times daily. To affected areas 60 g 6   No current facility-administered medications for this visit.    No Known Allergies    Discussed warning signs or symptoms. Please see discharge instructions. Patient expresses understanding.  I personally was present and performed or re-performed the history, physical exam and medical decision-making activities of this service and have verified that the service and findings are accurately documented in the student's note. ___________________________________________ Clementeen GrahamEvan Corey M.D., ABFM., CAQSM. Primary Care and Sports Medicine Adjunct Instructor of Family Medicine  [employer]

## 2019-02-27 ENCOUNTER — Encounter: Payer: Self-pay | Admitting: Family Medicine

## 2019-02-28 ENCOUNTER — Other Ambulatory Visit: Payer: Self-pay

## 2019-02-28 ENCOUNTER — Encounter: Payer: Self-pay | Admitting: Family Medicine

## 2019-02-28 ENCOUNTER — Ambulatory Visit (INDEPENDENT_AMBULATORY_CARE_PROVIDER_SITE_OTHER): Payer: BC Managed Care – PPO | Admitting: Family Medicine

## 2019-02-28 VITALS — BP 121/67 | Temp 97.8°F | Wt 260.0 lb

## 2019-02-28 DIAGNOSIS — J0110 Acute frontal sinusitis, unspecified: Secondary | ICD-10-CM | POA: Diagnosis not present

## 2019-02-28 DIAGNOSIS — H1013 Acute atopic conjunctivitis, bilateral: Secondary | ICD-10-CM

## 2019-02-28 MED ORDER — OLOPATADINE HCL 0.2 % OP SOLN: 1.0000 [drp] | Freq: Every day | OPHTHALMIC | 1 refills | Status: DC

## 2019-02-28 MED ORDER — IPRATROPIUM BROMIDE 0.06 % NA SOLN: 2.0000 | NASAL | 6 refills | Status: DC | PRN

## 2019-02-28 MED ORDER — CEFDINIR 300 MG PO CAPS: 300.0000 mg | ORAL_CAPSULE | Freq: Two times a day (BID) | ORAL | 0 refills | Status: AC

## 2019-02-28 NOTE — Progress Notes (Addendum)
Virtual Visit  via Video Note  I connected with      Benjamin Singleton  by a video enabled telemedicine application and verified that I am speaking with the correct person using two identifiers.   I discussed the limitations of evaluation and management by telemedicine and the availability of in person appointments. The patient expressed understanding and agreed to proceed.  History of Present Illness: Benjamin Singleton is a 53 y.o. male who would like to discuss nasal congestion.   [name] notes a 4 day history of nasal congestion.  He has been using Atrovent nasal spray has been helpful.  He notes some pain in left ear and left side of face and head. Mucus is green. No fever, chills. Mild cough. No body aches.  He also notes some itchy watery eyes BL. He thinks his symptoms are primarily due to seasonal allergies but he is worried he may have developed a sinus infection.  His current left-sided sinus pain and pressure are consistent with prior episodes of sinusitis.  He denies any fevers or chills body aches.  He denies any significant sick contacts.     Observations/Objective: BP 121/67   Temp 97.8 F (36.6 C) (Oral)   Wt 260 lb (117.9 kg)   BMI 37.31 kg/m  Wt Readings from Last 5 Encounters:  02/28/19 260 lb (117.9 kg)  12/19/18 262 lb (118.8 kg)  10/31/18 261 lb (118.4 kg)  06/06/18 216 lb (98 kg)  11/10/17 252 lb (114.3 kg)   Exam: Appearance nontoxic-appearing no acute distress Normal Speech.  No shortness of breath or hoarse voice quality No significant nasal discharge.  No conjunctival injection.  Lab and Radiology Results No results found for this or any previous visit (from the past 72 hour(s)). No results found.   Assessment and Plan: 53 y.o. male with sinusitis likely secondary to allergic rhinitis.  Plan to treat allergic rhinitis with daily nonsedating antihistamines. Recommend restarting Flonase nasal spray.  We will treat sinusitis with Omnicef antibiotic  as well as Atrovent nasal spray.  We will treat allergic conjunctivitis with Pataday  PDMP not reviewed this encounter. No orders of the defined types were placed in this encounter.  Meds ordered this encounter  Medications  . ipratropium (ATROVENT) 0.06 % nasal spray    Sig: Place 2 sprays into both nostrils every 4 (four) hours as needed for rhinitis.    Dispense:  10 mL    Refill:  6  . cefdinir (OMNICEF) 300 MG capsule    Sig: Take 1 capsule (300 mg total) by mouth 2 (two) times daily for 7 days.    Dispense:  14 capsule    Refill:  0  . Olopatadine HCl 0.2 % SOLN    Sig: Apply 1-2 drops to eye daily. Both eyes    Dispense:  1 Bottle    Refill:  1    Follow Up Instructions:    I discussed the assessment and treatment plan with the patient. The patient was provided an opportunity to ask questions and all were answered. The patient agreed with the plan and demonstrated an understanding of the instructions.   The patient was advised to call back or seek an in-person evaluation if the symptoms worsen or if the condition fails to improve as anticipated.  I provided 25 minutes of non-face-to-face time during this encounter.    Historical information moved to improve visibility of documentation.  Past Medical History:  Diagnosis Date  . Gastritis 06/14/2015  .  GERD (gastroesophageal reflux disease) 06/14/2015   No past surgical history on file. Social History   Tobacco Use  . Smoking status: Never Smoker  . Smokeless tobacco: Never Used  Substance Use Topics  . Alcohol use: Yes    Comment: social   family history includes Cancer in an other family member; Diabetes in an other family member; Hypertension in his mother and another family member.  Medications: Current Outpatient Medications  Medication Sig Dispense Refill  . diclofenac sodium (VOLTAREN) 1 % GEL Apply 4 g topically 4 (four) times daily. To affected joint.for pain 100 g 11  . ipratropium (ATROVENT) 0.06  % nasal spray Place 2 sprays into both nostrils every 4 (four) hours as needed for rhinitis. 10 mL 6  . Loratadine (CLARITIN) 10 MG CAPS Take by mouth.    . meclizine (ANTIVERT) 25 MG tablet Take 1 tablet (25 mg total) by mouth 3 (three) times daily as needed for dizziness. 30 tablet 0  . Multiple Vitamin (MULTIVITAMIN WITH MINERALS) TABS tablet Take 1 tablet by mouth daily.    Marland Kitchen. omeprazole (PRILOSEC) 40 MG capsule Take 1 capsule (40 mg total) by mouth daily. 30 capsule 3  . ondansetron (ZOFRAN ODT) 4 MG disintegrating tablet Take 1 tablet (4 mg total) by mouth every 6 (six) hours as needed for nausea or vomiting. 4mg  ODT q4 hours prn nausea/vomit 12 tablet 0  . sucralfate (CARAFATE) 1 g tablet Take 1 tablet (1 g total) by mouth 4 (four) times daily. 120 tablet 0  . triamcinolone ointment (KENALOG) 0.1 % Apply 1 application topically 2 (two) times daily. To affected areas 60 g 6  . cefdinir (OMNICEF) 300 MG capsule Take 1 capsule (300 mg total) by mouth 2 (two) times daily for 7 days. 14 capsule 0  . Olopatadine HCl 0.2 % SOLN Apply 1-2 drops to eye daily. Both eyes 1 Bottle 1   No current facility-administered medications for this visit.    No Known Allergies    Addendum to correct incorrect date due to templating error

## 2019-02-28 NOTE — Patient Instructions (Addendum)
Thank you for coming in today. Continue the anti-histamine allergy medicines like claritin.  Use the nasal spray as needed.  If not sufficient I can send in a different nasal spray that is an antihistamine.   Take the cefnidir antibiotic for 1 week.   Use the antihistamine eye drops Pataday.  If not available Use over-the-counter Zaditor eyedrops (Ketotifen) twice daily as needed for itching.   Recheck with me as needed.   It is now reasonable to wear a mask when out.    Sinusitis, Adult Sinusitis is inflammation of your sinuses. Sinuses are hollow spaces in the bones around your face. Your sinuses are located:  Around your eyes.  In the middle of your forehead.  Behind your nose.  In your cheekbones. Mucus normally drains out of your sinuses. When your nasal tissues become inflamed or swollen, mucus can become trapped or blocked. This allows bacteria, viruses, and fungi to grow, which leads to infection. Most infections of the sinuses are caused by a virus. Sinusitis can develop quickly. It can last for up to 4 weeks (acute) or for more than 12 weeks (chronic). Sinusitis often develops after a cold. What are the causes? This condition is caused by anything that creates swelling in the sinuses or stops mucus from draining. This includes:  Allergies.  Asthma.  Infection from bacteria or viruses.  Deformities or blockages in your nose or sinuses.  Abnormal growths in the nose (nasal polyps).  Pollutants, such as chemicals or irritants in the air.  Infection from fungi (rare). What increases the risk? You are more likely to develop this condition if you:  Have a weak body defense system (immune system).  Do a lot of swimming or diving.  Overuse nasal sprays.  Smoke. What are the signs or symptoms? The main symptoms of this condition are pain and a feeling of pressure around the affected sinuses. Other symptoms include:  Stuffy nose or congestion.  Thick drainage  from your nose.  Swelling and warmth over the affected sinuses.  Headache.  Upper toothache.  A cough that may get worse at night.  Extra mucus that collects in the throat or the back of the nose (postnasal drip).  Decreased sense of smell and taste.  Fatigue.  A fever.  Sore throat.  Bad breath. How is this diagnosed? This condition is diagnosed based on:  Your symptoms.  Your medical history.  A physical exam.  Tests to find out if your condition is acute or chronic. This may include: ? Checking your nose for nasal polyps. ? Viewing your sinuses using a device that has a light (endoscope). ? Testing for allergies or bacteria. ? Imaging tests, such as an MRI or CT scan. In rare cases, a bone biopsy may be done to rule out more serious types of fungal sinus disease. How is this treated? Treatment for sinusitis depends on the cause and whether your condition is chronic or acute.  If caused by a virus, your symptoms should go away on their own within 10 days. You may be given medicines to relieve symptoms. They include: ? Medicines that shrink swollen nasal passages (topical intranasal decongestants). ? Medicines that treat allergies (antihistamines). ? A spray that eases inflammation of the nostrils (topical intranasal corticosteroids). ? Rinses that help get rid of thick mucus in your nose (nasal saline washes).  If caused by bacteria, your health care provider may recommend waiting to see if your symptoms improve. Most bacterial infections will get better without  antibiotic medicine. You may be given antibiotics if you have: ? A severe infection. ? A weak immune system.  If caused by narrow nasal passages or nasal polyps, you may need to have surgery. Follow these instructions at home: Medicines  Take, use, or apply over-the-counter and prescription medicines only as told by your health care provider. These may include nasal sprays.  If you were prescribed an  antibiotic medicine, take it as told by your health care provider. Do not stop taking the antibiotic even if you start to feel better. Hydrate and humidify   Drink enough fluid to keep your urine pale yellow. Staying hydrated will help to thin your mucus.  Use a cool mist humidifier to keep the humidity level in your home above 50%.  Inhale steam for 10-15 minutes, 3-4 times a day, or as told by your health care provider. You can do this in the bathroom while a hot shower is running.  Limit your exposure to cool or dry air. Rest  Rest as much as possible.  Sleep with your head raised (elevated).  Make sure you get enough sleep each night. General instructions   Apply a warm, moist washcloth to your face 3-4 times a day or as told by your health care provider. This will help with discomfort.  Wash your hands often with soap and water to reduce your exposure to germs. If soap and water are not available, use hand sanitizer.  Do not smoke. Avoid being around people who are smoking (secondhand smoke).  Keep all follow-up visits as told by your health care provider. This is important. Contact a health care provider if:  You have a fever.  Your symptoms get worse.  Your symptoms do not improve within 10 days. Get help right away if:  You have a severe headache.  You have persistent vomiting.  You have severe pain or swelling around your face or eyes.  You have vision problems.  You develop confusion.  Your neck is stiff.  You have trouble breathing. Summary  Sinusitis is soreness and inflammation of your sinuses. Sinuses are hollow spaces in the bones around your face.  This condition is caused by nasal tissues that become inflamed or swollen. The swelling traps or blocks the flow of mucus. This allows bacteria, viruses, and fungi to grow, which leads to infection.  If you were prescribed an antibiotic medicine, take it as told by your health care provider. Do not  stop taking the antibiotic even if you start to feel better.  Keep all follow-up visits as told by your health care provider. This is important. This information is not intended to replace advice given to you by your health care provider. Make sure you discuss any questions you have with your health care provider. Document Released: 11/09/2005 Document Revised: 04/11/2018 Document Reviewed: 04/11/2018 Elsevier Interactive Patient Education  2019 [employer]

## 2019-04-09 IMAGING — DX DG TOE 4TH 2+V*R*
3 series · 3 of 3 positions shown · non-contrast
Comparison: Right fourth toe films of 03/23/2017 and 01/06/2017

CLINICAL DATA: Followup fracture of the proximal phalanx of the
right fourth toe

EXAM:
RIGHT FOURTH TOE

[toe ap]
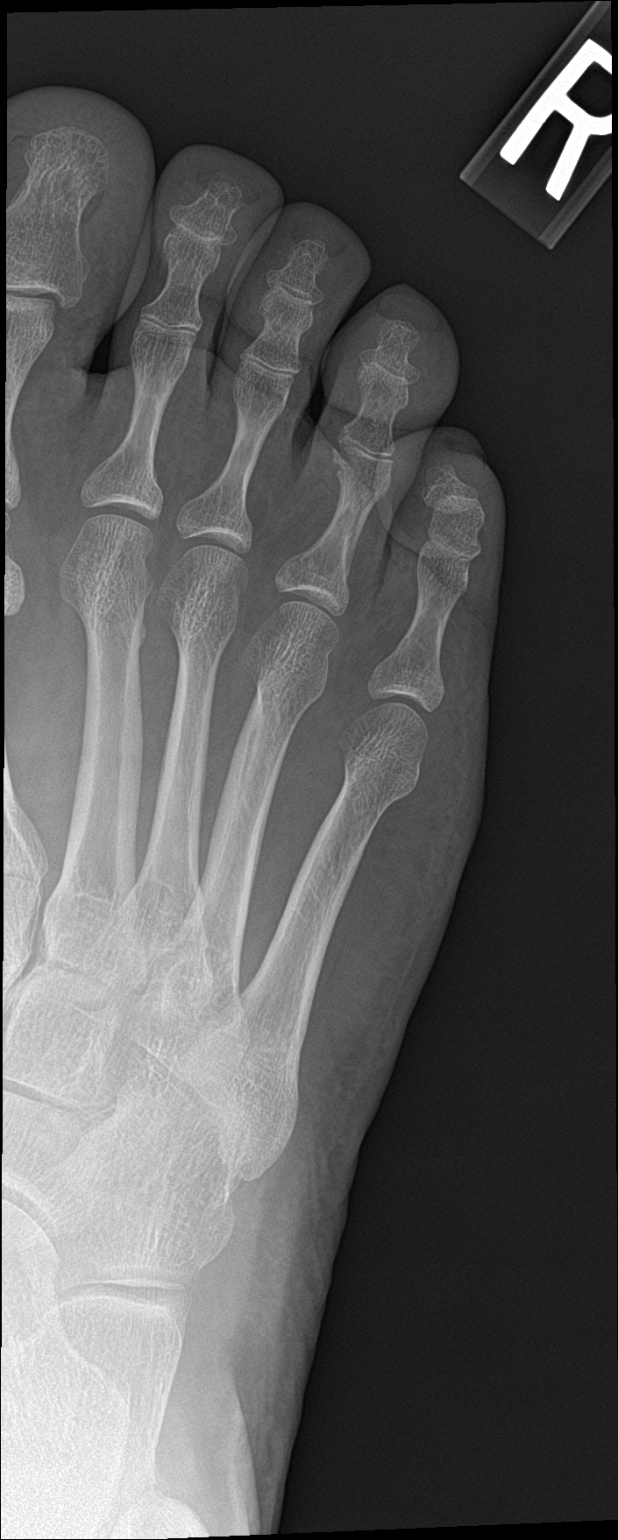

[toe obl]
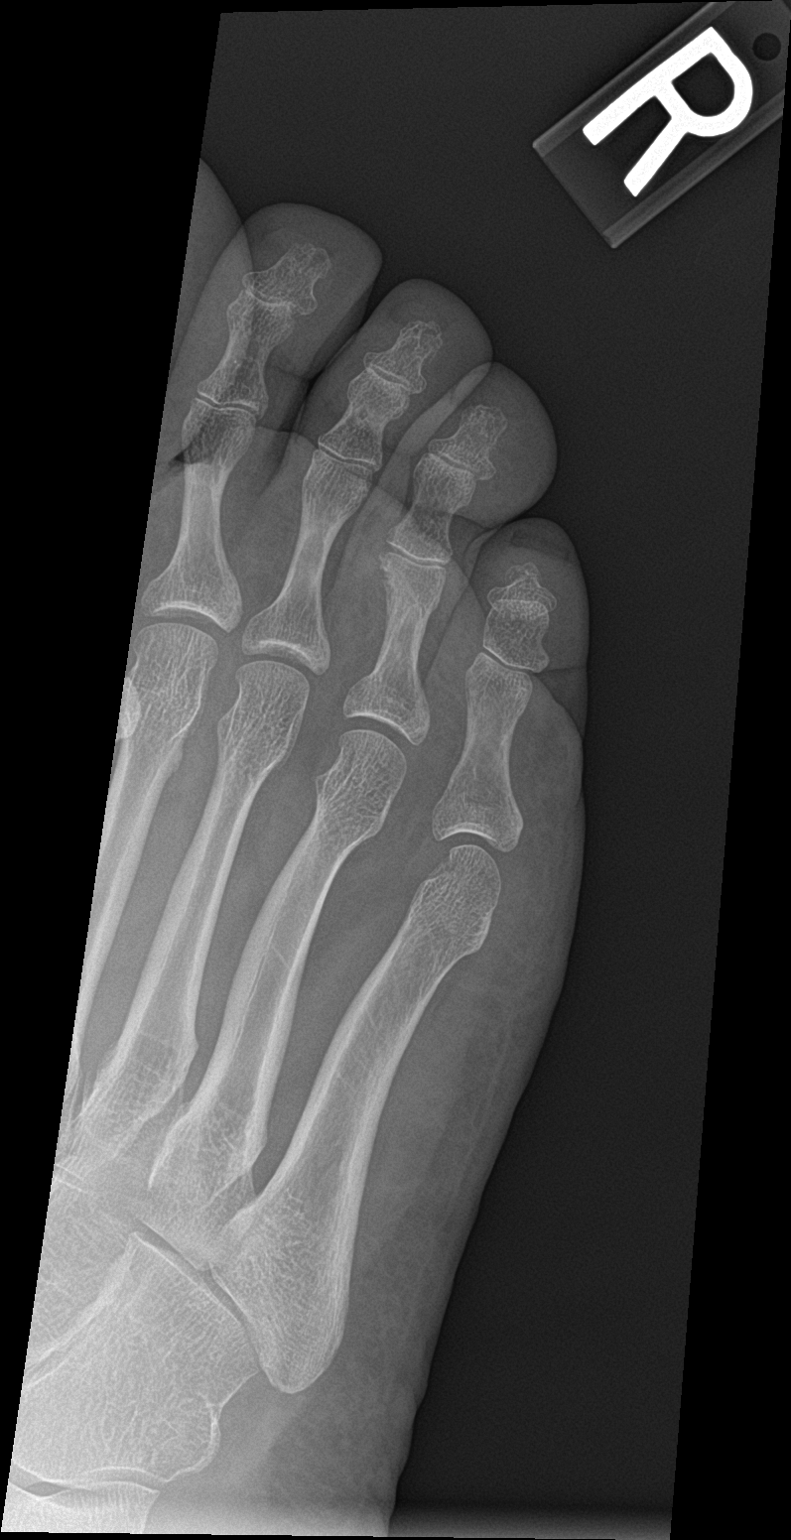

[toe lat]
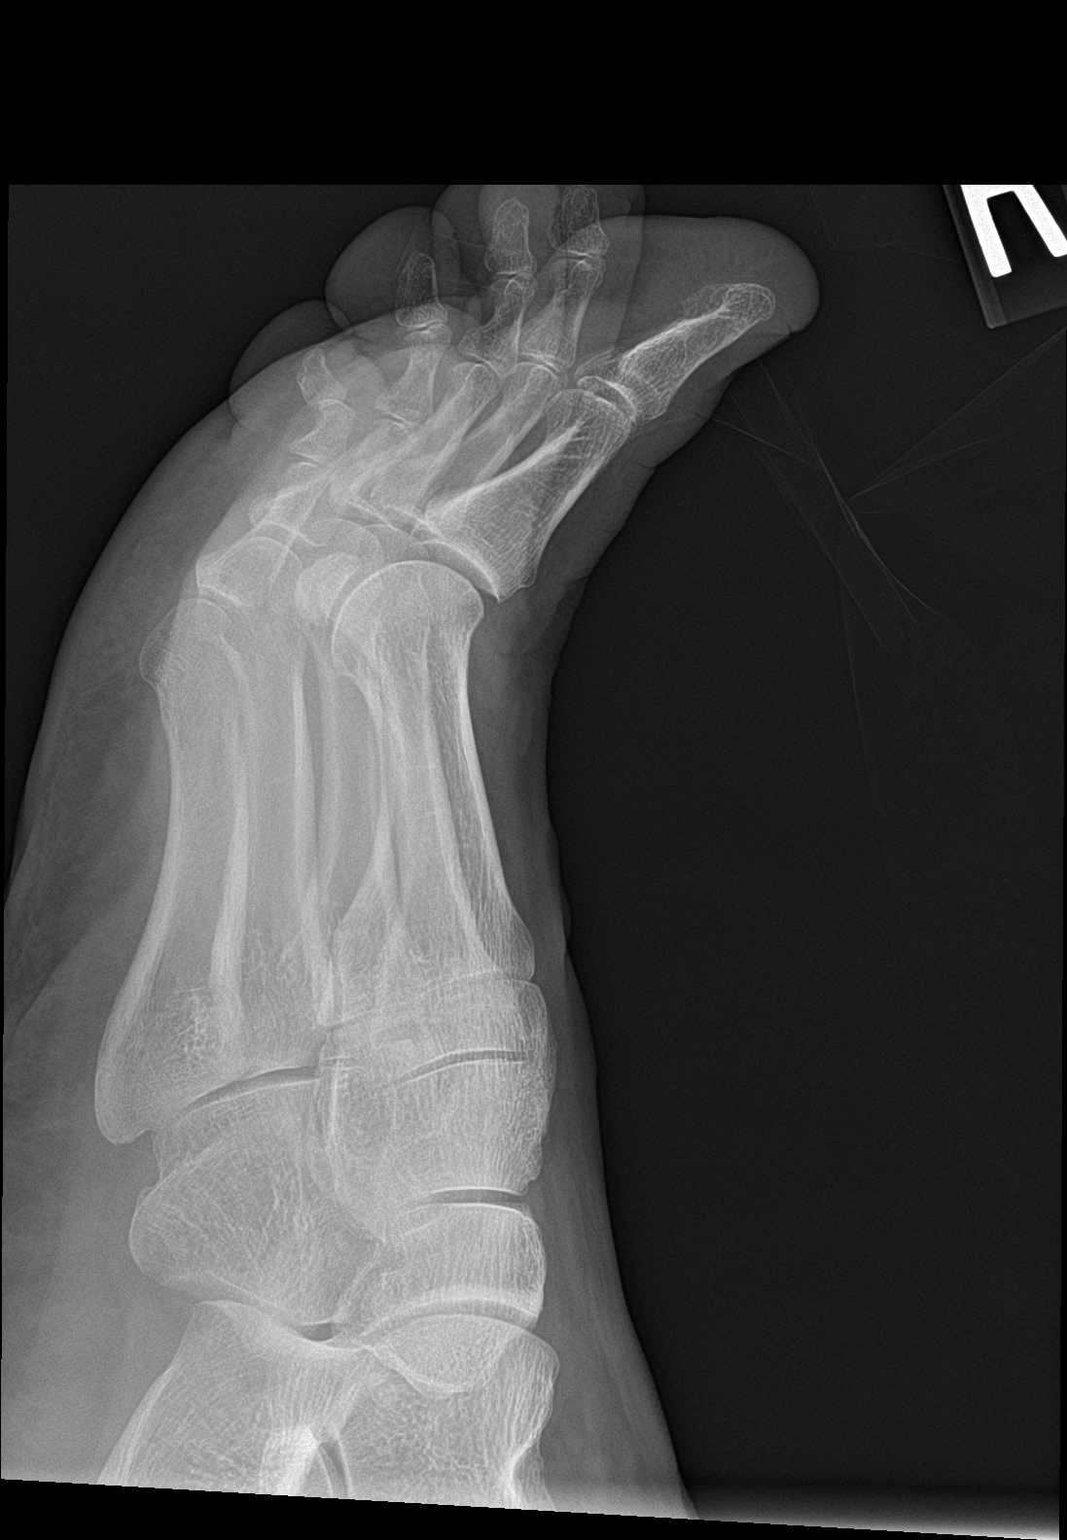

[3 of 3 positions shown; findings below may reference images not displayed]

FINDINGS: The fracture line is less visible through the proximal phalanx of
the right fourth toe indicating further interval healing. No acute
abnormality is seen. Joint spaces appear normal.
IMPRESSION: Further healing of the fracture through the proximal phalanx of the
right fourth toe.

## 2019-04-27 ENCOUNTER — Ambulatory Visit (INDEPENDENT_AMBULATORY_CARE_PROVIDER_SITE_OTHER): Payer: BC Managed Care – PPO | Admitting: Family Medicine

## 2019-04-27 ENCOUNTER — Encounter: Payer: Self-pay | Admitting: Family Medicine

## 2019-04-27 VITALS — BP 152/90 | HR 88 | Temp 98.1°F | Wt 270.0 lb

## 2019-04-27 DIAGNOSIS — L989 Disorder of the skin and subcutaneous tissue, unspecified: Secondary | ICD-10-CM

## 2019-04-27 DIAGNOSIS — J0101 Acute recurrent maxillary sinusitis: Secondary | ICD-10-CM

## 2019-04-27 DIAGNOSIS — M7989 Other specified soft tissue disorders: Secondary | ICD-10-CM | POA: Diagnosis not present

## 2019-04-27 DIAGNOSIS — I10 Essential (primary) hypertension: Secondary | ICD-10-CM | POA: Diagnosis not present

## 2019-04-27 MED ORDER — CEFDINIR 300 MG PO CAPS: 300.0000 mg | ORAL_CAPSULE | Freq: Two times a day (BID) | ORAL | 0 refills | Status: DC

## 2019-04-27 NOTE — Progress Notes (Signed)
Benjamin Singleton is a 53 y.o. male who [facility] today for nasal congestion sore throat and ear pressure.  Symptoms present for about 3 days.  He denies cough fevers chills nausea vomiting or diarrhea.  He has had symptoms like this multiple times in the past that he attributes to sinusitis.  He is done well with Omnicef in the past and would like to restart that as well if possible.  He is tried some over-the-counter medications which have not helped.  Additionally he notes that his blood pressure is elevated today in the office.  He notes when he checks his blood pressure at home is usually 120s over 70s.  He denies chest pain palpitations shortness of breath lightheadedness or dizziness.  Additionally he has bilateral lower extremity swelling.  This is occurred off and on over the past several months.  He is not sure why.  Again no shortness of breath or orthopnea.  No history of heart failure or kidney failure.  Lastly he notes a continued small skin lesion on his right lower leg.  This occurred in November and is a nonhealing wound.  This was treated with over-the-counter medication then followed by prescription antibacterial and anti-inflammatory medication topically.  He notes it never really got better.  He notes a small erythematous papule that sometimes itchy.  He is tried applying tea tree oil which is dried it up a little bit but not healed it.   ROS as above:  Exam:  BP (!) 152/90   Pulse 88   Temp 98.1 F (36.7 C) (Oral)   Wt 270 lb (122.5 kg)   BMI 38.74 kg/m  Wt Readings from Last 5 Encounters:  04/27/19 270 lb (122.5 kg)  02/28/19 260 lb (117.9 kg)  12/19/18 262 lb (118.8 kg)  10/31/18 261 lb (118.4 kg)  06/06/18 216 lb (98 kg)    Gen: Well NAD HEENT: EOMI,  MMM normal posterior pharynx.  Normal tympanic membranes bilaterally.  Mild  cervical lymphadenopathy.  Mildly tender palpation maxillary sinus bilaterally. Lungs: Normal work of breathing. CTABL Heart: RRR no MRG Abd: NABS, Soft. Nondistended, Nontender Exts: Brisk capillary refill, warm and well perfused.  Trace pitting edema bilateral lower extremities.  Calf diameters equal.  Skin: Small hyperpigmented erythematous papule on right medial calf.  Not particularly tender.  Shave biopsy: Consent obtained and timeout performed. Skin cleaned with alcohol and 1 mL of lidocaine with epinephrine injected achieving good anesthesia. After few minutes the skin was again cleaned with chlorhexidine and a shave biopsy was obtained of the skin lesion.  Dressing was applied.  Patient tolerates the procedure well.    Assessment and Plan: 53 y.o. male with  Sinusitis: Likely examination of upper respiratory symptoms.  Plan to treat with Omnicef and over-the-counter medications.  Use nasal spray as previously prescribed.  Recheck if not improving.  Skin lesion: Unclear etiology.  Plan for skin pathology shave biopsy and recheck as needed.  Lower leg swelling: Unclear etiology as well.  Patient has elevated blood pressure today and I suspect he may have some fluid retention.  Will check renal function.  Also will check CBC and TSH.  If all normal likely neck step will be echocardiogram.  May also investigate possible sleep apnea as patient does have obesity.  Elevated blood pressure: Blood pressure elevated in clinic today but typically well controlled at home with his home blood pressure cuff.  Recommend  patient return in 1 month for recheck and reevaluation.  Will bring home blood pressure cuff with him so we can compare.  PDMP not reviewed this encounter. Orders Placed This Encounter  Procedures  . CBC  . COMPLETE METABOLIC PANEL WITH GFR  . TSH  . LDL cholesterol, direct   Meds ordered this encounter  Medications  . cefdinir (OMNICEF) 300 MG capsule    Sig: Take 1  capsule (300 mg total) by mouth 2 (two) times daily.    Dispense:  14 capsule    Refill:  0     Historical information moved to improve visibility of documentation.  Past Medical History:  Diagnosis Date  . Gastritis 06/14/2015  . GERD (gastroesophageal reflux disease) 06/14/2015   No past surgical history on file. Social History   Tobacco Use  . Smoking status: Never Smoker  . Smokeless tobacco: Never Used  Substance Use Topics  . Alcohol use: Yes    Comment: social   family history includes Cancer in an other family member; Diabetes in an other family member; Hypertension in his mother and another family member.  Medications: Current Outpatient Medications  Medication Sig Dispense Refill  . diclofenac sodium (VOLTAREN) 1 % GEL Apply 4 g topically 4 (four) times daily. To affected joint.for pain 100 g 11  . ipratropium (ATROVENT) 0.06 % nasal spray Place 2 sprays into both nostrils every 4 (four) hours as needed for rhinitis. 10 mL 6  . Loratadine (CLARITIN) 10 MG CAPS Take by mouth.    . meclizine (ANTIVERT) 25 MG tablet Take 1 tablet (25 mg total) by mouth 3 (three) times daily as needed for dizziness. 30 tablet 0  . Multiple Vitamin (MULTIVITAMIN WITH MINERALS) TABS tablet Take 1 tablet by mouth daily.    . Olopatadine HCl 0.2 % SOLN Apply 1-2 drops to eye daily. Both eyes 1 Bottle 1  . omeprazole (PRILOSEC) 40 MG capsule Take 1 capsule (40 mg total) by mouth daily. 30 capsule 3  . ondansetron (ZOFRAN ODT) 4 MG disintegrating tablet Take 1 tablet (4 mg total) by mouth every 6 (six) hours as needed for nausea or vomiting. 4mg  ODT q4 hours prn nausea/vomit 12 tablet 0  . triamcinolone ointment (KENALOG) 0.1 % Apply 1 application topically 2 (two) times daily. To affected areas 60 g 6  . cefdinir (OMNICEF) 300 MG capsule Take 1 capsule (300 mg total) by mouth 2 (two) times daily. 14 capsule 0   No current facility-administered medications for this visit.    No Known Allergies    Discussed warning signs or symptoms. Please see discharge instructions. Patient expresses understanding.

## 2019-04-27 NOTE — Patient Instructions (Signed)
Thank you for coming in today. Get labs now.  Keep me updated.  We will evaluate leg swelling.  Recheck in 1 month.

## 2019-04-28 LAB — COMPLETE METABOLIC PANEL WITH GFR
AG Ratio: 1.7 (calc) (ref 1.0–2.5)
ALT: 24 U/L (ref 9–46)
AST: 19 U/L (ref 10–35)
Albumin: 4.5 g/dL (ref 3.6–5.1)
Alkaline phosphatase (APISO): 71 U/L (ref 35–144)
BUN: 10 mg/dL (ref 7–25)
CO2: 29 mmol/L (ref 20–32)
Calcium: 9.6 mg/dL (ref 8.6–10.3)
Chloride: 99 mmol/L (ref 98–110)
Creat: 0.8 mg/dL (ref 0.70–1.33)
GFR, Est African American: 117 mL/min/{1.73_m2} (ref >=60)
GFR, Est Non African American: 101 mL/min/{1.73_m2} (ref >=60)
Globulin: 2.6 g/dL (calc) (ref 1.9–3.7)
Glucose, Bld: 118 mg/dL — ABNORMAL HIGH (ref 65–99)
Potassium: 4.3 mmol/L (ref 3.5–5.3)
Sodium: 137 mmol/L (ref 135–146)
Total Bilirubin: 0.6 mg/dL (ref 0.2–1.2)
Total Protein: 7.1 g/dL (ref 6.1–8.1)

## 2019-04-28 LAB — TSH: TSH: 2.6 mIU/L (ref 0.40–4.50)

## 2019-04-28 LAB — CBC
HCT: 45.7 % (ref 38.5–50.0)
Hemoglobin: 15.1 g/dL (ref 13.2–17.1)
MCH: 29 pg (ref 27.0–33.0)
MCHC: 33 g/dL (ref 32.0–36.0)
MCV: 87.9 fL (ref 80.0–100.0)
MPV: 10.4 fL (ref 7.5–12.5)
Platelets: 285 10*3/uL (ref 140–400)
RBC: 5.2 10*6/uL (ref 4.20–5.80)
RDW: 12.9 % (ref 11.0–15.0)
WBC: 8.4 10*3/uL (ref 3.8–10.8)

## 2019-04-28 LAB — LDL CHOLESTEROL, DIRECT: Direct LDL: 101 mg/dL — ABNORMAL HIGH (ref <100)

## 2019-05-04 ENCOUNTER — Encounter: Payer: Self-pay | Admitting: Family Medicine

## 2019-05-04 DIAGNOSIS — L570 Actinic keratosis: Secondary | ICD-10-CM | POA: Insufficient documentation

## 2019-05-24 ENCOUNTER — Ambulatory Visit (INDEPENDENT_AMBULATORY_CARE_PROVIDER_SITE_OTHER): Payer: BC Managed Care – PPO | Admitting: Family Medicine

## 2019-05-24 ENCOUNTER — Encounter: Payer: Self-pay | Admitting: Family Medicine

## 2019-05-24 VITALS — BP 154/95 | HR 80 | Resp 16 | Ht 70.0 in | Wt 266.0 lb

## 2019-05-24 DIAGNOSIS — L570 Actinic keratosis: Secondary | ICD-10-CM | POA: Diagnosis not present

## 2019-05-24 DIAGNOSIS — H6991 Unspecified Eustachian tube disorder, right ear: Secondary | ICD-10-CM

## 2019-05-24 DIAGNOSIS — H6981 Other specified disorders of Eustachian tube, right ear: Secondary | ICD-10-CM | POA: Diagnosis not present

## 2019-05-24 DIAGNOSIS — Z6838 Body mass index (BMI) 38.0-38.9, adult: Secondary | ICD-10-CM

## 2019-05-24 DIAGNOSIS — I10 Essential (primary) hypertension: Secondary | ICD-10-CM

## 2019-05-24 DIAGNOSIS — E66812 Obesity, class 2: Secondary | ICD-10-CM

## 2019-05-24 HISTORY — DX: Essential (primary) hypertension: I10

## 2019-05-24 MED ORDER — IPRATROPIUM BROMIDE 0.06 % NA SOLN: 2.0000 | NASAL | 6 refills | Status: DC | PRN

## 2019-05-24 NOTE — Patient Instructions (Signed)
Thank you for coming in today. Continue weight loss and exeicise.  Continue blood pressure monitoring.  If blood pressure over 120s continues we will need to start medicine.  Think about sleep apnea.  Recheck as needed.    Cryosurgery for Skin Conditions, Care After This sheet gives you information about how to care for yourself after your procedure. Your health care provider may also give you more specific instructions. If you have problems or questions, contact your health care provider. What can I expect after the procedure? After your procedure, it is common to have redness, swelling, and a blister that forms over the treated area. The blister may contain a small amount of blood. After about 2 weeks, the blister will break on its own, leaving a scab. Then the treated area will heal. After healing, there is usually little or no scarring. Follow these instructions at home: Caring for the treated area   Follow instructions from your health care provider about how to take care of the treated area. Make sure you: ? Keep the area covered with a bandage (dressing) until it heals, or for as long as told by your health care provider. ? Wash your hands with soap and water before you change your dressing. If soap and water are not available, use hand sanitizer. ? Change your dressing as told by your health care provider. ? Keep the dressing and the treated area clean and dry. If the dressing gets wet, change it right away. ? Clean the treated area with soap and water.  Check the treated area every day for signs of infection. Check for: ? More redness, swelling, or pain. ? More fluid or blood. ? Warmth. ? Pus or a bad smell. General instructions  Do not pick at your blister or try to break it open. This can cause infection and scarring.  Do not apply any medicine, cream, or lotion to the treated area unless directed by your health care provider.  Take over-the-counter and prescription  medicines only as told by your health care provider.  Keep all follow-up visits as told by your health care provider. This is important. Contact a health care provider if:  You have more redness, swelling, or pain around the treated area.  You have more fluid or blood coming from the treated area.  The treated area feels warm to the touch.  You have pus or a bad smell coming from the treated area.  Your blister becomes large and painful. Get help right away if:  You have a fever and have redness spreading from the treated area. Summary  The treated area will become red and swollen shortly after the procedure.  You should keep the treated area and your dressing clean and dry.  Check the treated area every day for signs of infection, such as fluid, pus, warmth, or having more redness, swelling, or pain.  Do not pick at your blister or try to break it open. This information is not intended to replace advice given to you by your health care provider. Make sure you discuss any questions you have with your health care provider. Document Released: 05/29/2005 Document Revised: 10/22/2017 Document Reviewed: 09/28/2016 Elsevier Patient Education  2020 Elsevier Inc.    Actinic Keratosis An actinic keratosis is a precancerous growth on the skin. If there is more than one growth, the condition is called actinic keratoses. Actinic keratoses appear most often on areas of skin that get a lot of sun exposure, including the scalp, face,  ears, lips, upper back, forearms, and the backs of the hands. If left untreated, these growths may develop into a skin cancer called squamous cell carcinoma. It is important to have all these growths checked by a health care provider to determine the best treatment approach. What are the causes? Actinic keratoses are caused by getting too much ultraviolet (UV) radiation from the sun or other UV light sources. What increases the risk? You are more likely to  develop this condition if you:  Have light-colored skin and blue eyes.  Have blond or red hair.  Spend a lot of time in the sun.  Do not protect your skin from the sun when outdoors.  Are an older person. The risk of developing an actinic keratosis increases with age. What are the signs or symptoms? Actinic keratoses feel like scaly, rough spots of skin. Symptoms of this condition include growths that may:  Be as small as a pinhead or as big as a quarter.  Itch, hurt, or feel sensitive.  Be skin-colored, light tan, dark tan, pink, or a combination of any of these colors. In most cases, the growths become red.  Have a small piece of pink or gray skin (skin tag) growing from them. It may be easier to notice actinic keratoses by feeling them, rather than seeing them. Sometimes, actinic keratoses disappear, but many reappear a few days to a few weeks later. How is this diagnosed? This condition is usually diagnosed with a physical exam.  A tissue sample may be removed from the actinic keratosis and examined under a microscope (biopsy). How is this treated? If needed, this condition may be treated by:  Scraping off the actinic keratosis (curettage).  Freezing the actinic keratosis with liquid nitrogen (cryosurgery). This causes the growth to eventually fall off the skin.  Applying medicated creams or gels to destroy the cells in the growth.  Applying chemicals to the actinic keratosis to make the outer layers of skin peel off (chemical peel).  Using photodynamic therapy. In this procedure, medicated cream is applied to the actinic keratosis. This cream increases your skin's sensitivity to light. Then, a strong light is aimed at the actinic keratosis to destroy cells in the growth. Follow these instructions at home: Skin care  Apply cool, wet cloths (cool compresses) to the affected areas.  Do not scratch your skin.  Check your skin regularly for any growths, especially growths  that: ? Start to itch or bleed. ? Change in size, shape, or color. Caring for the treated area  Keep the treated area clean and dry as told by your health care provider.  Do not apply any medicine, cream, or lotion to the treated area unless your health care provider tells you to do that.  Do not pick at blisters or try to break them open. This can cause infection and scarring.  If you have red or irritated skin after treatment, follow instructions from your health care provider about how to take care of the treated area. Make sure you: ? Wash your hands with soap and water before you change your bandage (dressing). If soap and water are not available, use hand sanitizer. ? Change your dressing as told by your health care provider.  If you have red or irritated skin after treatment, check your treated area every day for signs of infection. Check for: ? Redness, swelling, or pain. ? Fluid or blood. ? Warmth. ? Pus or a bad smell. General instructions  Take or apply over-the-counter  and prescription medicines only as told by your health care provider.  Return to your normal activities as told by your health care provider. Ask your health care provider what activities are safe for you.  Have a skin exam done every year by a health care provider who is a skin specialist (dermatologist).  Keep all follow-up visits as told by your health care provider. This is important. Lifestyle  Do not use any products that contain nicotine or tobacco, such as cigarettes and e-cigarettes. If you need help quitting, ask your health care provider.  Take steps to protect your skin from the sun. ? Try to avoid the sun between 10:00 a.m. and 4:00 p.m. This is when the UV light is the strongest. ? Use a sunscreen or sunblock with SPF 30 (sun protection factor 30) or greater. ? Apply sunscreen before you are exposed to sunlight and reapply as often as directed by the instructions on the sunscreen container.  ? Always wear sunglasses that have UV protection, and always wear a hat and clothing to protect your skin from sunlight. ? When possible, avoid medicines that increase your sensitivity to sunlight. ? Do not use tanning beds or other indoor tanning devices. Contact a health care provider if:  You notice any changes or new growths on your skin.  You have swelling, pain, or more redness around your treated area.  You have fluid or blood coming from your treated area.  Your treated area feels warm to the touch.  You have pus or a bad smell coming from your treated area.  You have a fever.  You have a blister that becomes large and painful. Summary  An actinic keratosis is a precancerous growth on the skin. If there is more than one growth, the condition is called actinic keratoses. In some cases, if left untreated, these growths can develop into skin cancer.  Check your skin regularly for any growths, especially growths that start to itch or bleed, or change in size, shape, or color.  Take steps to protect your skin from the sun.  Contact a health care provider if you notice any changes or new growths on your skin.  Keep all follow-up visits as told by your health care provider. This is important. This information is not intended to replace advice given to you by your health care provider. Make sure you discuss any questions you have with your health care provider. Document Released: 02/05/2009 Document Revised: 03/22/2018 Document Reviewed: 03/22/2018 Elsevier Patient Education  2020 [employer]

## 2019-05-24 NOTE — Progress Notes (Signed)
Benjamin Singleton is a 53 y.o. male who presents to [facility]: [place] today for follow-up hypertension leg swelling skin lesion and discuss ear congestion.  Hypertension: Patient had elevated blood pressure last visit with systolic blood pressures in the 150s.  He has been checking his blood pressure at home antioxidants typically in the 120s to 130s.  He feels more anxious in the medical office.  He has worked to lose weight and increase exercise over the last month and has lost about 5 pounds.  He feels well.  He notes his snoring is improved as well.  Additionally at the last visit he had a small nonhealing skin lesion on the right lower leg.  This was evaluated with a shave biopsy which showed a superficial actinic keratosis.  However the margins extended to the base of the lesion.  He notes the wound is pretty well healed in the last month.  Additionally he notes some continued ear congestion.  He takes over-the-counter medicines for allergies for this.  He is used an Atrovent nasal spray which has helped his ear congestion some.  Also notes this helps his nasal congestion he like to continue it if possible.   ROS as above:  Exam:  BP (!) 154/95   Pulse 80   Resp 16   Ht 5\' 10"  (1.778 m)   Wt 266 lb (120.7 kg)   BMI 38.17 kg/m  Wt Readings from Last 5 Encounters:  05/24/19 266 lb (120.7 kg)  04/27/19 270 lb (122.5 kg)  02/28/19 260 lb (117.9 kg)  12/19/18 262 lb (118.8 kg)  10/31/18 261 lb (118.4 kg)    Gen: Well NAD HEENT: EOMI,  MMM right ear light serous effusion with no erythema.  Left ear normal.  Nontender mastoids. Lungs: Normal work of breathing. CTABL Heart: RRR no MRG Abd: NABS, Soft. Nondistended, Nontender Exts: Brisk capillary refill, warm and well perfused.  No edema bilateral lower extremities. Right leg skin: Small dime size circular hyperpigmented  scar.  Nontender.   Cryotherapy right leg actinic keratosis: Consent obtained timeout performed. Circular skin lesion/scar treated with liquid nitrogen jet to achieve Frost circle for 15 seconds times total 3 treatments.  Patient tolerated treatment procedure well  Lab and Radiology Results Recent Results (from the past 2160 hour(s))  CBC     Status: None   Collection Time: 04/27/19 12:09 PM  Result Value Ref Range   WBC 8.4 3.8 - 10.8 Thousand/uL   RBC 5.20 4.20 - 5.80 Million/uL   Hemoglobin 15.1 13.2 - 17.1 g/dL   HCT 45.7 38.5 - 50.0 %   MCV 87.9 80.0 - 100.0 fL   MCH 29.0 27.0 - 33.0 pg   MCHC 33.0 32.0 - 36.0 g/dL   RDW 12.9 11.0 - 15.0 %   Platelets 285 140 - 400 Thousand/uL   MPV 10.4 7.5 - 12.5 fL  COMPLETE METABOLIC PANEL WITH GFR     Status: Abnormal   Collection Time: 04/27/19 12:09 PM  Result Value Ref Range   Glucose, Bld 118 (H) 65 - 99 mg/dL    Comment: .            Fasting reference interval . For someone without known diabetes, a glucose value between 100 and 125 mg/dL is consistent with prediabetes and should be confirmed with a follow-up test. .    BUN 10 7 - 25 mg/dL   Creat 0.80 0.70 - 1.33 mg/dL  Comment: For patients >55 years of age, the reference limit for Creatinine is approximately 13% higher for people identified as African-American. .    GFR, Est Non African American 101 > OR = 60 mL/min/1.4873m2   GFR, Est African American 117 > OR = 60 mL/min/1.8073m2   BUN/Creatinine Ratio NOT APPLICABLE 6 - 22 (calc)   Sodium 137 135 - 146 mmol/L   Potassium 4.3 3.5 - 5.3 mmol/L   Chloride 99 98 - 110 mmol/L   CO2 29 20 - 32 mmol/L   Calcium 9.6 8.6 - 10.3 mg/dL   Total Protein 7.1 6.1 - 8.1 g/dL   Albumin 4.5 3.6 - 5.1 g/dL   Globulin 2.6 1.9 - 3.7 g/dL (calc)   AG Ratio 1.7 1.0 - 2.5 (calc)   Total Bilirubin 0.6 0.2 - 1.2 mg/dL   Alkaline phosphatase (APISO) 71 35 - 144 U/L   AST 19 10 - 35 U/L   ALT 24 9 - 46 U/L  TSH     Status: None    Collection Time: 04/27/19 12:09 PM  Result Value Ref Range   TSH 2.60 0.40 - 4.50 mIU/L  LDL cholesterol, direct     Status: Abnormal   Collection Time: 04/27/19 12:09 PM  Result Value Ref Range   Direct LDL 101 (H) <100 mg/dL    Comment: Greatly elevated Triglycerides values (>1200 mg/dL) interfere with the dLDL assay. As no Triglycerides  testing was ordered, interpret results with caution. . Desirable range <100 mg/dL for primary prevention;   <70 mg/dL for patients with CHD or diabetic patients  with > or = 2 CHD risk factors. .       Assessment and Plan: 53 y.o. male with hypertension: Blood pressure elevated in clinic x2 however better controlled at home.  We discussed options.  Ideally I would like to use hydrochlorothiazide or lisinopril or similar medication.  However patient would like to continue lifestyle modification.  Will work on weight loss and exercise.  Additionally could consider screen for sleep apnea however patient would like to wait on that as well. Plan to continue home blood pressure log and recheck as needed.  Actinic keratosis right leg: Confirmed with pathology.  Plan to treat with liquid nitrogen jet as above.  Recheck as needed.  Ear congestion: Likely eustachian tube dysfunction.  Plan to continue over-the-counter antihistamines.  Atrovent nasal spray probably will not help this very much however he is having some other sinus congestion issues that he has had experience benefit from with the nasal spray.  We will continue Atrovent as well.  PDMP not reviewed this encounter. No orders of the defined types were placed in this encounter.  Meds ordered this encounter  Medications  . ipratropium (ATROVENT) 0.06 % nasal spray    Sig: Place 2 sprays into both nostrils every 4 (four) hours as needed for rhinitis.    Dispense:  10 mL    Refill:  6     Historical information moved to improve visibility of documentation.  Past Medical History:  Diagnosis  Date  . Gastritis 06/14/2015  . GERD (gastroesophageal reflux disease) 06/14/2015  . Hypertension 05/24/2019   History reviewed. No pertinent surgical history. Social History   Tobacco Use  . Smoking status: Never Smoker  . Smokeless tobacco: Never Used  Substance Use Topics  . Alcohol use: Yes    Comment: social   family history includes Cancer in an other family member; Diabetes in an other family member; Hypertension in  his mother and another family member.  Medications: Current Outpatient Medications  Medication Sig Dispense Refill  . diclofenac sodium (VOLTAREN) 1 % GEL Apply 4 g topically 4 (four) times daily. To affected joint.for pain 100 g 11  . ipratropium (ATROVENT) 0.06 % nasal spray Place 2 sprays into both nostrils every 4 (four) hours as needed for rhinitis. 10 mL 6  . Multiple Vitamin (MULTIVITAMIN WITH MINERALS) TABS tablet Take 1 tablet by mouth daily.    Marland Kitchen. omeprazole (PRILOSEC) 40 MG capsule Take 1 capsule (40 mg total) by mouth daily. 30 capsule 3  . ondansetron (ZOFRAN ODT) 4 MG disintegrating tablet Take 1 tablet (4 mg total) by mouth every 6 (six) hours as needed for nausea or vomiting. 4mg  ODT q4 hours prn nausea/vomit 12 tablet 0   No current facility-administered medications for this visit.    No Known Allergies   Discussed warning signs or symptoms. Please see discharge instructions. Patient expresses understanding.

## 2020-02-22 ENCOUNTER — Ambulatory Visit: Payer: BC Managed Care – PPO | Attending: Internal Medicine

## 2020-02-22 DIAGNOSIS — Z23 Encounter for immunization: Secondary | ICD-10-CM

## 2020-02-22 NOTE — Progress Notes (Signed)
   Covid-19 Vaccination Clinic  Name:  Benjamin Singleton    MRN: [id number] DOB: October 08, 1966  02/22/2020  Mr. Benjamin Singleton was observed post Covid-19 immunization for 15 minutes without incident. He was provided with Vaccine Information Sheet and instruction to access the V-Safe system.   Mr. Benjamin Singleton was instructed to call 911 with any severe reactions post vaccine: Marland Kitchen Difficulty breathing  . Swelling of face and throat  . A fast heartbeat  . A bad rash all over body  . Dizziness and weakness   Immunizations Administered    Name Date Dose VIS Date Route   Pfizer COVID-19 Vaccine 02/22/2020  9:57 AM 0.3 mL 11/03/2019 Intramuscular   Manufacturer: [employer], [employer]   Lot: [id number]   NDC: [address]-5

## 2020-03-20 ENCOUNTER — Ambulatory Visit: Payer: BC Managed Care – PPO | Attending: Internal Medicine

## 2020-03-20 DIAGNOSIS — Z23 Encounter for immunization: Secondary | ICD-10-CM

## 2020-03-20 NOTE — Progress Notes (Signed)
   Covid-19 Vaccination Clinic  Name:  Davidjames Blansett    MRN: [id number] DOB: 1966-07-02  03/20/2020  Mr. Delphina Cahill was observed post Covid-19 immunization for 15 minutes without incident. He was provided with Vaccine Information Sheet and instruction to access the V-Safe system.   Mr. Delphina Cahill was instructed to call 911 with any severe reactions post vaccine: Marland Kitchen Difficulty breathing  . Swelling of face and throat  . A fast heartbeat  . A bad rash all over body  . Dizziness and weakness   Immunizations Administered    Name Date Dose VIS Date Route   Pfizer COVID-19 Vaccine 03/20/2020  9:32 AM 0.3 mL 01/17/2019 Intramuscular   Manufacturer: [employer], [employer]   Lot: [id number]   NDC: [address]-9

## 2020-07-11 ENCOUNTER — Other Ambulatory Visit: Payer: Self-pay

## 2020-07-11 ENCOUNTER — Encounter: Payer: Self-pay | Admitting: Osteopathic Medicine

## 2020-07-11 ENCOUNTER — Ambulatory Visit (INDEPENDENT_AMBULATORY_CARE_PROVIDER_SITE_OTHER): Payer: BC Managed Care – PPO | Admitting: Osteopathic Medicine

## 2020-07-11 VITALS — BP 122/73 | HR 82 | Wt 282.0 lb

## 2020-07-11 DIAGNOSIS — K219 Gastro-esophageal reflux disease without esophagitis: Secondary | ICD-10-CM | POA: Diagnosis not present

## 2020-07-11 DIAGNOSIS — Z13220 Encounter for screening for lipoid disorders: Secondary | ICD-10-CM

## 2020-07-11 DIAGNOSIS — Z0000 Encounter for general adult medical examination without abnormal findings: Secondary | ICD-10-CM

## 2020-07-11 DIAGNOSIS — I10 Essential (primary) hypertension: Secondary | ICD-10-CM | POA: Diagnosis not present

## 2020-07-11 DIAGNOSIS — Z131 Encounter for screening for diabetes mellitus: Secondary | ICD-10-CM

## 2020-07-11 DIAGNOSIS — L989 Disorder of the skin and subcutaneous tissue, unspecified: Secondary | ICD-10-CM | POA: Diagnosis not present

## 2020-07-11 NOTE — Patient Instructions (Signed)
Weight loss: important things to remember  Things to remember for diet changes for weight loss:   Please note - I am not a certified dietician. I can present you with ideas and general diet goals, but a meal plan is largely up to you. I am happy to refer you to a dietician who can give you a detailed meal plan.  Apps/logs can be helpful to track how you're eating! It's not realistic to be logging everything you eat forever, but when you're starting a healthy eating lifestyle it's very helpful, and checking in with logs now and then helps you stick to your program!   Calorie restriction / portion control with the goal weight loss of no more than one to one and a half pounds per week.   Increase lean protein such as chicken, fish, [place].   Decrease fatty foods such as dairy, butter.   Decrease sugary foods and sweets. Avoid sugary drinks such as soda or juice.  Increase fiber found in fruit and vegetables, whole grains.   Things to remember for exercise for weight loss:   (Exercise is important for lots of reasons and I encourage patients to be active! DIET changes tend to be more effective for weight loss.)   Please note - I am not a certified Systems analyst. I can present you with ideas and general workout goals, but an exercise program is largely up to you. Find something you can stick with, and something you enjoy!   Slow progression will help prevent injury!  As you progress in your exercise regimen think about gradually increasing the following, week by week:   intensity (how strenuous is your workout?)  frequency (how often are you exercising?)  duration (how many minutes at a time are you exercising?)  Walking for 20 minutes a day is certainly better than nothing, but more strenuous exercise will develop better cardiovascular fitness.   interval training (high-intensity alternating with low-intensity, think walk/jog rather than just walk)  muscle strengthening exercises  (weight lifting, calisthenics, yoga) - this also helps prevent osteoporosis!    Medications approved for long-term use for obesity  Qsymia (Phentermine + Topiramate) - may help migraines/headaches. Can cause "brain fog" in some people.   Saxenda (Liraglutide daily injections) - will also help diabetes/prediabetes. Most common side effect is nausea.   Wegovy (Semaglutide weekly injections) - will also help diabetes/prediabetes. Most common side effect is nausea.   Contrave (Bupropion + Naltrexone) - may help mental health/depression   Orlistat (Xenical Rx, Alli OTC) - not my favorite, can cause diarrhea  Bupropion (Wellbutrin) - generic antidepressant, will also help mental health, may help with quitting smoking  I recommend that you research the above medications and see which one(s) your insurance may or may not cover: If you call your insurance, ask them specifically what medications are on their formulary that are approved for obesity treatment. They should be able to send you a list or tell you over the phone. Remember, medications aren't magic! You MUST be diligent about lifestyle changes as well!

## 2020-07-11 NOTE — Progress Notes (Signed)
Benjamin Singleton is a 54 y.o. male who presents to  [facility] at [facility]  today, 07/11/20, seeking care for the following:  . Skin check - concerned about place on R calf doesn't seem to be healing after biopsy last year. On exam, appears to be scarred biopsy site, no erythema, no discoloration, symmetrical, smooth borders, about 0.5 cm diameter, stable for months.  . Working on weight loss -we discussed strategies/goals in detail.      ASSESSMENT & PLAN with other pertinent findings:  The primary encounter diagnosis was Skin problem. Diagnoses of Essential hypertension, Gastroesophageal reflux disease, unspecified whether esophagitis present, Screening, lipid, Screening for diabetes mellitus, and Annual physical exam were also pertinent to this visit.   No results found for this or any previous visit (from the past 24 hour(s)).  Labs ordered for future visit. Annual physical / preventive care was NOT performed or billed today.   Patient Instructions  Weight loss: important things to remember  Things to remember for diet changes for weight loss:   Please note - I am not a certified dietician. I can present you with ideas and general diet goals, but a meal plan is largely up to you. I am happy to refer you to a dietician who can give you a detailed meal plan.  Apps/logs can be helpful to track how you're eating! It's not realistic to be logging everything you eat forever, but when you're starting a healthy eating lifestyle it's very helpful, and checking in with logs now and then helps you stick to your program!   Calorie restriction / portion control with the goal weight loss of no more than one to one and a half pounds per week.   Increase lean protein such as chicken, fish, [place].   Decrease fatty foods such as dairy, butter.   Decrease sugary foods and sweets. Avoid sugary drinks such as soda or juice.  Increase fiber found in fruit  and vegetables, whole grains.   Things to remember for exercise for weight loss:   (Exercise is important for lots of reasons and I encourage patients to be active! DIET changes tend to be more effective for weight loss.)   Please note - I am not a certified Systems analyst. I can present you with ideas and general workout goals, but an exercise program is largely up to you. Find something you can stick with, and something you enjoy!   Slow progression will help prevent injury!  As you progress in your exercise regimen think about gradually increasing the following, week by week:   intensity (how strenuous is your workout?)  frequency (how often are you exercising?)  duration (how many minutes at a time are you exercising?)  Walking for 20 minutes a day is certainly better than nothing, but more strenuous exercise will develop better cardiovascular fitness.   interval training (high-intensity alternating with low-intensity, think walk/jog rather than just walk)  muscle strengthening exercises (weight lifting, calisthenics, yoga) - this also helps prevent osteoporosis!    Medications approved for long-term use for obesity  Qsymia (Phentermine + Topiramate) - may help migraines/headaches. Can cause "brain fog" in some people.   Saxenda (Liraglutide daily injections) - will also help diabetes/prediabetes. Most common side effect is nausea.   Wegovy (Semaglutide weekly injections) - will also help diabetes/prediabetes. Most common side effect is nausea.   Contrave (Bupropion + Naltrexone) - may help mental health/depression   Orlistat (Xenical Rx, Alli OTC) -  not my favorite, can cause diarrhea  Bupropion (Wellbutrin) - generic antidepressant, will also help mental health, may help with quitting smoking  I recommend that you research the above medications and see which one(s) your insurance may or may not cover: If you call your insurance, ask them specifically what medications are  on their formulary that are approved for obesity treatment. They should be able to send you a list or tell you over the phone. Remember, medications aren't magic! You MUST be diligent about lifestyle changes as well!      Orders Placed This Encounter  Procedures  . CBC  . COMPLETE METABOLIC PANEL WITH GFR  . Lipid panel  . PSA, Total with Reflex to PSA, Free  . Hemoglobin A1c    No orders of the defined types were placed in this encounter.      Follow-up instructions: Return in about 6 weeks (around 08/22/2020) for [place] (get labs prior to visit, orders are in).                                         BP 122/73   Pulse 82   Wt 282 lb (127.9 kg)   SpO2 97%   BMI 40.46 kg/m   Current Meds  Medication Sig  . diclofenac sodium (VOLTAREN) 1 % GEL Apply 4 g topically 4 (four) times daily. To affected joint.for pain  . ipratropium (ATROVENT) 0.06 % nasal spray Place 2 sprays into both nostrils every 4 (four) hours as needed for rhinitis.  . Multiple Vitamin (MULTIVITAMIN WITH MINERALS) TABS tablet Take 1 tablet by mouth daily.  Marland Kitchen omeprazole (PRILOSEC) 40 MG capsule Take 1 capsule (40 mg total) by mouth daily.    No results found for this or any previous visit (from the past 72 hour(s)).  No results found.     All questions at time of visit were answered - patient instructed to contact office with any additional concerns or updates.  ER/RTC precautions were reviewed with the patient as applicable.   Please note: voice recognition software was used to produce this document, and typos may escape review. Please contact Dr. Lyn Hollingshead for any needed clarifications.   Total encounter time: 30 minutes.

## 2020-10-29 ENCOUNTER — Ambulatory Visit: Payer: BC Managed Care – PPO | Attending: Internal Medicine

## 2020-10-29 DIAGNOSIS — Z23 Encounter for immunization: Secondary | ICD-10-CM

## 2020-10-29 NOTE — Progress Notes (Signed)
   Covid-19 Vaccination Clinic  Name:  Benjamin Singleton    MRN: [id number] DOB: [birth year]  10/29/2020  Mr. Benjamin Singleton was observed post Covid-19 immunization for 15 minutes without incident. He was provided with Vaccine Information Sheet and instruction to access the V-Safe system.   Mr. Benjamin Singleton was instructed to call 911 with any severe reactions post vaccine: Marland Kitchen Difficulty breathing  . Swelling of face and throat  . A fast heartbeat  . A bad rash all over body  . Dizziness and weakness   Immunizations Administered    Name Date Dose VIS Date Route   Pfizer COVID-19 Vaccine 10/29/2020  3:48 PM 0.3 mL 09/11/2020 Intramuscular   Manufacturer: [employer], [employer]   Lot: [id number]   NDC: [address]-6

## 2020-10-31 ENCOUNTER — Encounter: Payer: Self-pay | Admitting: Emergency Medicine

## 2020-10-31 ENCOUNTER — Other Ambulatory Visit: Payer: Self-pay

## 2020-10-31 ENCOUNTER — Emergency Department
Admission: EM | Admit: 2020-10-31 | Discharge: 2020-10-31 | Disposition: A | Discharge status: Home / Self Care | Payer: BC Managed Care – PPO | Source: Home / Self Care

## 2020-10-31 DIAGNOSIS — R519 Headache, unspecified: Secondary | ICD-10-CM

## 2020-10-31 DIAGNOSIS — R030 Elevated blood-pressure reading, without diagnosis of hypertension: Secondary | ICD-10-CM | POA: Diagnosis not present

## 2020-10-31 NOTE — ED Provider Notes (Signed)
[facility]    CSN: [id number] Arrival date & time: 10/31/20  1027      History   Chief Complaint Chief Complaint  Patient presents with  . Headache    Covid Booster on Tuesday    HPI Benjamin Singleton is a 54 y.o. male.   HPI  Benjamin Singleton is a 54 y.o. male presenting to UC with c/o generalized HA that started 2 days ago after receiving his COVID booster.  Associated generalized body aches, which have resolved.  He took his BP at home and was concerned ti was 149/88. HA is 3/10.  Last dose of tylenol 500mg  was last night around 10PM. No pain medication taken today.   No change in vision. No photophobia, dizziness or nausea.  Per medical records, hx of HTN in the past with BP in the 150s/90s but pt states he has not been prescribed BP medication in the past. Tries to manage with diet and exercise. Denies chest pain or SOB.  Pt notes he had his annual physical at the end of August 2021 and had labs ordered but due to a busy schedule, he has not been able to get his labs drawn.  States he drank some Gatorade today, is not fasting to have them drawn today either.    Past Medical History:  Diagnosis Date  . Gastritis 06/14/2015  . GERD (gastroesophageal reflux disease) 06/14/2015  . Hypertension 05/24/2019    Patient Active Problem List   Diagnosis Date Noted  . Hypertension 05/24/2019  . Actinic keratosis 05/04/2019  . Acute pain of left shoulder 03/23/2017  . Constipation 12/10/2015  . Gastritis 06/14/2015  . GERD (gastroesophageal reflux disease) 06/14/2015  . Obese 06/14/2015  . Snores 06/14/2015    History reviewed. No pertinent surgical history.     Home Medications    Prior to Admission medications   Medication Sig Start Date End Date Taking? Authorizing Provider  Multiple Vitamin (MULTIVITAMIN WITH MINERALS) TABS tablet Take 1 tablet by mouth daily.   Yes [provider]  diclofenac sodium (VOLTAREN) 1 % GEL Apply 4 g topically 4 (four) times  daily. To affected joint.for pain Patient not taking: Reported on 10/31/2020 12/19/18   12/21/18, MD  ipratropium (ATROVENT) 0.06 % nasal spray Place 2 sprays into both nostrils every 4 (four) hours as needed for rhinitis. Patient not taking: Reported on 10/31/2020 05/24/19   07/25/19, MD  omeprazole (PRILOSEC) 40 MG capsule Take 1 capsule (40 mg total) by mouth daily. Patient not taking: Reported on 10/31/2020 11/10/17   11/12/17, MD    Family History Family History  Problem Relation Age of Onset  . Hypertension Mother   . Hypertension Other   . Diabetes Other   . Cancer Other     Social History Social History   Tobacco Use  . Smoking status: Never Smoker  . Smokeless tobacco: Never Used  Substance Use Topics  . Alcohol use: Yes    Comment: social  . Drug use: No     Allergies   Penicillins   Review of Systems Review of Systems  Constitutional: Negative for chills and fever.  HENT: Negative for congestion, ear pain, sore throat, trouble swallowing and voice change.   Respiratory: Negative for cough and shortness of breath.   Cardiovascular: Negative for chest pain and palpitations.  Gastrointestinal: Negative for abdominal pain, diarrhea, nausea and vomiting.  Musculoskeletal: Negative for arthralgias, back pain and myalgias.  Skin: Negative for rash.  Neurological: Positive for headaches. Negative for dizziness and light-headedness.  All other systems reviewed and are negative.    Physical Exam Triage Vital Signs ED Triage Vitals  Enc Vitals Group     BP 10/31/20 0901 133/84     Pulse Rate 10/31/20 0901 84     Resp 10/31/20 0901 17     Temp 10/31/20 0901 98.5 F (36.9 C)     Temp Source 10/31/20 0901 Oral     SpO2 10/31/20 0901 96 %     Weight 10/31/20 0903 286 lb 9.6 oz (130 kg)     Height 10/31/20 0903 5\' 10"  (1.778 m)     Head Circumference --      Peak Flow --      Pain Score 10/31/20 0903 3     Pain Loc --      Pain Edu? --      Excl.  in GC? --    No data found.  Updated Vital Signs BP 133/84 (BP Location: Right Arm)   Pulse 84   Temp 98.5 F (36.9 C) (Oral)   Resp 17   Ht 5\' 10"  (1.778 m)   Wt 266 lb (120.7 kg)   SpO2 96%   BMI 38.17 kg/m   Visual Acuity Right Eye Distance:   Left Eye Distance:   Bilateral Distance:    Right Eye Near:   Left Eye Near:    Bilateral Near:     Physical Exam Vitals and nursing note reviewed.  Constitutional:      General: He is not in acute distress.    Appearance: He is well-developed and well-nourished. He is not ill-appearing, toxic-appearing or diaphoretic.  HENT:     Head: Normocephalic and atraumatic.     Right Ear: Tympanic membrane and ear canal normal.     Left Ear: Tympanic membrane and ear canal normal.     Nose: Nose normal.     Right Sinus: No maxillary sinus tenderness or frontal sinus tenderness.     Left Sinus: No maxillary sinus tenderness or frontal sinus tenderness.     Mouth/Throat:     Lips: Pink.     Mouth: Mucous membranes are moist.     Pharynx: Oropharynx is clear. Uvula midline.  Eyes:     Extraocular Movements: Extraocular movements intact and EOM normal.     Pupils: Pupils are equal, round, and reactive to light.  Cardiovascular:     Rate and Rhythm: Normal rate and regular rhythm.  Pulmonary:     Effort: Pulmonary effort is normal. No respiratory distress.     Breath sounds: Normal breath sounds. No stridor. No wheezing, rhonchi or rales.  Musculoskeletal:        General: Normal range of motion.     Cervical back: Normal range of motion and neck supple.  Skin:    General: Skin is warm and dry.  Neurological:     Mental Status: He is alert and oriented to person, place, and time.     GCS: GCS eye subscore is 4. GCS verbal subscore is 5. GCS motor subscore is 6.  Psychiatric:        Mood and Affect: Mood and affect and mood normal.        Behavior: Behavior normal.      UC Treatments / Results  Labs (all labs ordered are  listed, but only abnormal results are displayed) Labs Reviewed - No data to display  EKG   Radiology No results found.  Procedures Procedures (including critical care time)  Medications Ordered in UC Medications - No data to display  Initial Impression / Assessment and Plan / UC Course  I have reviewed the triage vital signs and the nursing notes.  Pertinent labs & imaging results that were available during my care of the patient were reviewed by me and considered in my medical decision making (see chart for details).     Normal neuro exam.  BP 133/84, reassured pt of normal BP Encouraged good hydration and rest, may take Tylenol and ibuprofen for pain Encouraged to get his annual labs drawn as recommended by his PCP Discussed symptoms that warrant emergent care in the ED. AVS given  Final Clinical Impressions(s) / UC Diagnoses   Final diagnoses:  Elevated blood pressure reading  Generalized headache     Discharge Instructions      You may take 500mg  acetaminophen every 4-6 hours or in combination with ibuprofen 400-600mg  every 6-8 hours as needed for pain, inflammation, and fever.  Be sure to well hydrated with clear liquids and get at least 8 hours of sleep at night, preferably more while sick.   Please be sure to have your primary care provider re-order the lab work from your annual physical and try to get that blood work later today or tomorrow.  Ask the lab how long they want you to be fasting.     ED Prescriptions    None     PDMP not reviewed this encounter.   , PA-C 10/31/20 1120

## 2020-10-31 NOTE — Discharge Instructions (Signed)
  You may take 500mg  acetaminophen every 4-6 hours or in combination with ibuprofen 400-600mg  every 6-8 hours as needed for pain, inflammation, and fever.  Be sure to well hydrated with clear liquids and get at least 8 hours of sleep at night, preferably more while sick.   Please be sure to have your primary care provider re-order the lab work from your annual physical and try to get that blood work later today or tomorrow.  Ask the lab how long they want you to be fasting.

## 2020-10-31 NOTE — ED Triage Notes (Signed)
covid booster on Tuesday - fever H/A & high BP (149/88) HR was 88   All symptoms started yesterday  Tylenol at 10pm last night (500mg )

## 2021-04-07 LAB — CBC
Hemoglobin: 15.5 g/dL (ref 13.2–17.1)
MCHC: 33.5 g/dL (ref 32.0–36.0)
MPV: 10.1 fL (ref 7.5–12.5)

## 2021-04-08 LAB — CBC
HCT: 46.2 % (ref 38.5–50.0)
MCH: 30 pg (ref 27.0–33.0)
MCV: 89.5 fL (ref 80.0–100.0)
Platelets: 256 10*3/uL (ref 140–400)
RBC: 5.16 10*6/uL (ref 4.20–5.80)
RDW: 13 % (ref 11.0–15.0)
WBC: 5.8 10*3/uL (ref 3.8–10.8)

## 2021-04-08 LAB — LIPID PANEL
Cholesterol: 167 mg/dL (ref <200)
HDL: 44 mg/dL (ref >=40)
LDL Cholesterol (Calc): 106 mg/dL (calc) — ABNORMAL HIGH
Non-HDL Cholesterol (Calc): 123 mg/dL (calc) (ref <130)
Total CHOL/HDL Ratio: 3.8 (calc) (ref <5.0)
Triglycerides: 81 mg/dL (ref <150)

## 2021-04-08 LAB — COMPLETE METABOLIC PANEL WITH GFR
AG Ratio: 1.9 (calc) (ref 1.0–2.5)
ALT: 19 U/L (ref 9–46)
AST: 18 U/L (ref 10–35)
Albumin: 4.4 g/dL (ref 3.6–5.1)
Alkaline phosphatase (APISO): 65 U/L (ref 35–144)
BUN: 14 mg/dL (ref 7–25)
CO2: 28 mmol/L (ref 20–32)
Calcium: 9.4 mg/dL (ref 8.6–10.3)
Chloride: 103 mmol/L (ref 98–110)
Creat: 0.82 mg/dL (ref 0.70–1.33)
GFR, Est African American: 115 mL/min/{1.73_m2} (ref >=60)
GFR, Est Non African American: 99 mL/min/{1.73_m2} (ref >=60)
Globulin: 2.3 g/dL (calc) (ref 1.9–3.7)
Glucose, Bld: 89 mg/dL (ref 65–99)
Potassium: 4.4 mmol/L (ref 3.5–5.3)
Sodium: 141 mmol/L (ref 135–146)
Total Bilirubin: 0.5 mg/dL (ref 0.2–1.2)
Total Protein: 6.7 g/dL (ref 6.1–8.1)

## 2021-04-08 LAB — HEMOGLOBIN A1C
Hgb A1c MFr Bld: 5.7 % of total Hgb — ABNORMAL HIGH (ref <5.7)
Mean Plasma Glucose: 117 mg/dL
eAG (mmol/L): 6.5 mmol/L

## 2021-04-08 LAB — PSA, TOTAL WITH REFLEX TO PSA, FREE: PSA, Total: 1.3 ng/mL (ref <=4.0)

## 2021-04-17 ENCOUNTER — Telehealth (INDEPENDENT_AMBULATORY_CARE_PROVIDER_SITE_OTHER): Payer: Managed Care, Other (non HMO) | Admitting: Osteopathic Medicine

## 2021-04-17 ENCOUNTER — Encounter: Payer: Self-pay | Admitting: Osteopathic Medicine

## 2021-04-17 VITALS — BP 131/76 | HR 55 | Wt 250.0 lb

## 2021-04-17 DIAGNOSIS — Z1211 Encounter for screening for malignant neoplasm of colon: Secondary | ICD-10-CM

## 2021-04-17 DIAGNOSIS — R3912 Poor urinary stream: Secondary | ICD-10-CM

## 2021-04-17 NOTE — Progress Notes (Signed)
Telemedicine Visit via  Video & Audio (App used: MyChart)   I connected with [name] on 04/17/21 at 8:16 AM  by phone or  telemedicine application as noted above  I verified that I am speaking with or regarding  the correct patient using two identifiers.  Participants: Myself, Dr Sunnie Nielsen DO Patient: Benjamin Singleton Patient proxy if applicable: none Other, if applicable: husband also present   Patient is at home I am in office at [facility]    I discussed the limitations of evaluation and management  by telemedicine and the availability of in person appointments.  The participant(s) above expressed understanding and  agreed to proceed with this appointment via telemedicine.       History of Present Illness: Benjamin Singleton is a 55 y.o. male who would like to discuss  Chief Complaint  Patient presents with  . Referral     Hemorrhoid: 2 weeks ongoing, better than it was but he's concerned it's still there after using witch hazel OTC treatment  Urinary issue: 2 weeks ongoing, difficulty initiating urinary stream, weak stream, feels blocked. Pt is concerned given (+)FH prostate CA in father (pt recently had negative PSA in 03/2021), (+)FH renal stone in brother.        Observations/Objective: BP 131/76   Pulse (!) 55   Wt 250 lb (113.4 kg)   BMI 35.87 kg/m  BP Readings from Last 3 Encounters:  04/17/21 131/76  10/31/20 133/84  07/11/20 122/73   Wt Readings from Last 3 Encounters:  04/17/21 250 lb (113.4 kg)  10/31/20 266 lb (120.7 kg)  07/11/20 282 lb (127.9 kg)    Exam: Normal Speech.  NAD  Lab and Radiology Results No results found for this or any previous visit (from the past 72 hour(s)). No results found.     Assessment and Plan: 55 y.o. male with The primary encounter diagnosis was Weak urinary stream. A diagnosis of Colon cancer screening was also pertinent to this visit.  --> UA pending, sounds like possible BPH but no  daytime symptoms, reassuring recent PSA, offered [facility]om[facility] vs urology referral pt would like to wait for now. If hematuria, will w/u  --> offered GI referral for definitive treatment of hemorrhoid. He is due for colon cancer screening anyway so will send referral, he is not sure he wants any procedure for hemorrhoid, will consider this   PDMP not reviewed this encounter. Orders Placed This Encounter  Procedures  . Urine Culture  . Urine Microscopic  . Urinalysis  . Ambulatory referral to Gastroenterology    Referral Priority:   Routine    Referral Type:   Consultation    Referral Reason:   Specialty Services Required    Number of Visits Requested:   1   No orders of the defined types were placed in this encounter.    Follow Up Instructions: Return if symptoms worsen or fail to improve.    I discussed the assessment and treatment plan with the patient. The patient was provided an opportunity to ask questions and all were answered. The patient agreed with the plan and demonstrated an understanding of the instructions.   The patient was advised to call back or seek an in-person evaluation if any new concerns, if symptoms worsen or if the condition fails to improve as anticipated.  30 minutes of non-face-to-face time was provided during this encounter.      . . . . . . . . . . . . . Marland Kitchen  Historical information moved to improve visibility of documentation.  Past Medical History:  Diagnosis Date  . Gastritis 06/14/2015  . GERD (gastroesophageal reflux disease) 06/14/2015  . Hypertension 05/24/2019   No past surgical history on file. Social History   Tobacco Use  . Smoking status: Never Smoker  . Smokeless tobacco: Never Used  Substance Use Topics  . Alcohol use: Yes    Comment: social   family history includes Cancer in an other family member; Diabetes in an other family member; Hypertension in his mother and another family  member.  Medications: Current Outpatient Medications  Medication Sig Dispense Refill  . Multiple Vitamin (MULTIVITAMIN WITH MINERALS) TABS tablet Take 1 tablet by mouth daily.     No current facility-administered medications for this visit.   Allergies  Allergen Reactions  . Penicillins Rash     If phone visit, billing and coding can please add appropriate modifier if needed

## 2021-05-09 ENCOUNTER — Ambulatory Visit: Payer: Managed Care, Other (non HMO)

## 2021-07-18 ENCOUNTER — Other Ambulatory Visit (HOSPITAL_BASED_OUTPATIENT_CLINIC_OR_DEPARTMENT_OTHER): Payer: Self-pay

## 2021-07-18 ENCOUNTER — Ambulatory Visit: Payer: Managed Care, Other (non HMO) | Attending: Internal Medicine

## 2021-07-18 ENCOUNTER — Other Ambulatory Visit: Payer: Self-pay

## 2021-07-18 DIAGNOSIS — Z23 Encounter for immunization: Secondary | ICD-10-CM

## 2021-07-18 MED ORDER — PFIZER-BIONT COVID-19 VAC-TRIS 30 MCG/0.3ML IM SUSP
INTRAMUSCULAR | 0 refills | Status: DC
  Filled 2021-07-18: qty 0.3, 1d supply, fill #0

## 2021-07-18 NOTE — Progress Notes (Signed)
   Covid-19 Vaccination Clinic  Name:  Benjamin Singleton    MRN: [id number] DOB: 1965-12-21  07/18/2021  Mr. Benjamin Singleton was observed post Covid-19 immunization for 15 minutes without incident. He was provided with Vaccine Information Sheet and instruction to access the V-Safe system.   Mr. Benjamin Singleton was instructed to call 911 with any severe reactions post vaccine: Difficulty breathing  Swelling of face and throat  A fast heartbeat  A bad rash all over body  Dizziness and weakness   Immunizations Administered     Name Date Dose VIS Date Route   PFIZER Comrnaty(Gray TOP) Covid-19 Vaccine 07/18/2021  1:57 PM 0.3 mL 10/31/2020 Intramuscular   Manufacturer: [employer], [employer]   Lot: [id number]   NDC: [phone]

## 2021-09-02 ENCOUNTER — Ambulatory Visit (INDEPENDENT_AMBULATORY_CARE_PROVIDER_SITE_OTHER): Payer: Managed Care, Other (non HMO)

## 2021-09-02 ENCOUNTER — Other Ambulatory Visit: Payer: Self-pay

## 2021-09-02 ENCOUNTER — Ambulatory Visit: Payer: Managed Care, Other (non HMO) | Admitting: Sports Medicine

## 2021-09-02 DIAGNOSIS — M5136 Other intervertebral disc degeneration, lumbar region: Secondary | ICD-10-CM | POA: Diagnosis not present

## 2021-09-02 DIAGNOSIS — Z6838 Body mass index (BMI) 38.0-38.9, adult: Secondary | ICD-10-CM

## 2021-09-02 DIAGNOSIS — M545 Low back pain, unspecified: Secondary | ICD-10-CM

## 2021-09-02 DIAGNOSIS — M51369 Other intervertebral disc degeneration, lumbar region without mention of lumbar back pain or lower extremity pain: Secondary | ICD-10-CM | POA: Insufficient documentation

## 2021-09-02 MED ORDER — KETOROLAC TROMETHAMINE 60 MG/2ML IM SOLN
30.0000 mg | Freq: Once | INTRAMUSCULAR | Status: AC
  Administered 2021-09-02: 30 mg via INTRAMUSCULAR

## 2021-09-02 MED ORDER — PREDNISONE 50 MG PO TABS: ORAL_TABLET | ORAL | 0 refills | Status: DC

## 2021-09-02 NOTE — Progress Notes (Signed)
    Procedures performed today:    None.  Independent interpretation of notes and tests performed by another provider:   I did personally review 8 abdominal and pelvic CT from about 5 years ago that shows DDD at L4-L5 with loss of disc height and vacuum disc phenomenon at L5-S1.  Brief History, Exam, Impression, and Recommendations:    Lumbar degenerative disc disease This is a very pleasant 55 year old male, for the past day he has had increasing pain in his low back, axial, occasional radiation down to the first toe on the left. No red flag symptoms. We will treat him aggressively, Toradol 30 intramuscular today, 5 days of prednisone, x-rays, physical therapy with aquatic therapy [facility].  Return in 6 weeks.  Obese I have encouraged Bryor to establish with Dr. Ashley Royalty and work aggressively on weight loss.    ___________________________________________ Ihor Austin. Benjamin Stain, M.D., ABFM., CAQSM. Primary Care and Sports Medicine [facility]  Adjunct Instructor of Family Medicine  [employer]

## 2021-09-02 NOTE — Assessment & Plan Note (Signed)
I have encouraged Benjamin Singleton to establish with Dr. Ashley Royalty and work aggressively on weight loss.

## 2021-09-02 NOTE — Assessment & Plan Note (Signed)
This is a very pleasant 55 year old male, for the past day he has had increasing pain in his low back, axial, occasional radiation down to the first toe on the left. No red flag symptoms. We will treat him aggressively, Toradol 30 intramuscular today, 5 days of prednisone, x-rays, physical therapy with aquatic therapy at the [facility].  Return in 6 weeks.

## 2021-09-02 NOTE — Addendum Note (Signed)
Addended by: Gaynelle Arabian on: 09/02/2021 02:41 PM   Modules accepted: Orders

## 2021-09-16 ENCOUNTER — Emergency Department
Admission: EM | Admit: 2021-09-16 | Discharge: 2021-09-16 | Discharge status: Against Medical Advice | Payer: Managed Care, Other (non HMO) | Source: Home / Self Care

## 2021-09-17 ENCOUNTER — Ambulatory Visit: Payer: Managed Care, Other (non HMO) | Admitting: Sports Medicine

## 2021-09-25 ENCOUNTER — Ambulatory Visit (HOSPITAL_BASED_OUTPATIENT_CLINIC_OR_DEPARTMENT_OTHER): Payer: Managed Care, Other (non HMO) | Attending: Sports Medicine | Admitting: Physical Therapy

## 2021-09-25 ENCOUNTER — Encounter (HOSPITAL_BASED_OUTPATIENT_CLINIC_OR_DEPARTMENT_OTHER): Payer: Self-pay | Admitting: Physical Therapy

## 2021-09-25 ENCOUNTER — Other Ambulatory Visit: Payer: Self-pay

## 2021-09-25 DIAGNOSIS — M545 Low back pain, unspecified: Secondary | ICD-10-CM

## 2021-09-25 DIAGNOSIS — M5136 Other intervertebral disc degeneration, lumbar region: Secondary | ICD-10-CM | POA: Diagnosis not present

## 2021-09-25 NOTE — Therapy (Signed)
OUTPATIENT PHYSICAL THERAPY THORACOLUMBAR EVALUATION   Patient Name: Benjamin Singleton MRN: [id number] DOB:[birth year], 55 y.o., male Today's Date: 09/26/2021   PT End of Session - 09/25/21 1534     Visit Number 1    Number of Visits 7    Date for PT Re-Evaluation 11/06/21    PT Start Time 1437    PT Stop Time 1527    PT Time Calculation (min) 50 min    Activity Tolerance Patient tolerated treatment well    Behavior During Therapy [facility] for tasks assessed/performed             Past Medical History:  Diagnosis Date   Gastritis 06/14/2015   GERD (gastroesophageal reflux disease) 06/14/2015   Hypertension 05/24/2019   Sleep apnea    History reviewed. No pertinent surgical history. Patient Active Problem List   Diagnosis Date Noted   Lumbar degenerative disc disease 09/02/2021   Hypertension 05/24/2019   Actinic keratosis 05/04/2019   Acute pain of left shoulder 03/23/2017   Constipation 12/10/2015   Gastritis 06/14/2015   GERD (gastroesophageal reflux disease) 06/14/2015   Obese 06/14/2015   Snores 06/14/2015    PCP: Sunnie Nielsen, DO  REFERRING PROVIDER: Monica Becton,*  REFERRING DIAG: N86.76 (ICD-10-CM) - Lumbar degenerative disc disease   THERAPY DIAG:  Low Back Pain  ONSET DATE: 09/01/2021  SUBJECTIVE:                                                                                                                                                                                           SUBJECTIVE STATEMENT: -Pain began having lumbar pain on 09/01/21 with no specific MOI.  Pt states he has occasionally experienced back pain over the years.  He did not receive treatment in the past and his pain went away on its own.  Pt went to MD on 09/02/21.  Pt had x rays and received a Toradol 30 intramuscular injection and prescription for 5 day course of prednisone.  Pt reports improved sx's with IM injection and prednisone.   -MD ordered PT including aquatic  therapy.  MD script indicated 1-2 times per week for 4-6 weeks;  Decrease pain, increase strength, flexibility, function, and range of motion; Modalities may include, traction, ionto, phono, and stim.  May include dry needling with or without stim. -Pt has increased pain with work activities.  Occasional glute/hip pain with sitting.  Pt states walking does not increase back pain.  Pt is able to perform household chores without increased pain.  Pt is able to drive without pain.  Pt reports having pain with his workout activities.   -Pt reports he  had a fall on his bottom greater than 20 years ago.  He did some aquatic PT which helped.  Pt states he was informed he has a LLD by a PT.   -Pt fell on 09/16/2021 at work and went to ED.  He had x rays showing acute triquetral fractures seen which is not significantly displaced in L wrist.    PERTINENT HISTORY:  Obesity, current L wrist fracture  PAIN:  Are you having pain?  VAS scale: 0/10, 1/10 worst pain "Pinch" Pain location: L sided lumbar pain Pain description: He c/o's of a pinch in L sided lumbar.  Pt has occasional numbness in R toes though 1st he felt it in L great toe. Aggravating factors: work and workout activities Relieving factors: IM injection, prednisone  PRECAUTIONS: Current L wrist fx  WEIGHT BEARING RESTRICTIONS none in LEs.  L UE due to current wrist fracture  FALLS:  Has patient fallen in last 6 months? 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  He is not working currently due to L wrist.    PLOF: Independent.  Pt tries to workout 1-2 days per week.  Pt was able to perform work activities without significant back pain.    PATIENT GOALS to be pain free.  To get rid of the pinch he feels.   OBJECTIVE:   DIAGNOSTIC FINDINGS: (In Epic) There is intervertebral disc space narrowing at L5-S1 with mild degenerative endplate change. The  remaining disc spaces are overall preserved. There is no significant facet arthropathy seen.   The SI joints are intact.  The soft tissues are unremarkable.   IMPRESSION: Mild degenerative changes at L5-S1. Otherwise, unremarkable lumbar spine radiographs.  PATIENT SURVEYS:  FOTO [id number]    OBSERVATION:  Velcro thumb spica splint on L wrist  COGNITION:  Overall cognitive status: Within functional limits for tasks assessed     SENSATION:  Light touch: Appears intact. 2+ sensation in bilat LE dermatomes     LUMBARAROM/PROM  A/PROM A/PROM  09/26/2021  Flexion WNL  Extension WNL with "pinch"  Right lateral flexion WFL  Left lateral flexion WFL with "pinch"  Right rotation [facility]  Left rotation WFL   (Blank rows = not tested)  LE AROM/PROM:  A/PROM Right 09/26/2021 Left 09/26/2021  Hip flexion    Hip extension    Hip abduction    Hip adduction    Hip internal rotation    Hip external rotation [facility]  Knee flexion    Knee extension    Ankle dorsiflexion    Ankle plantarflexion    Ankle inversion    Ankle eversion     (Blank rows = not tested)  LE MMT:  MMT Right 09/26/2021 Left 09/26/2021  Hip flexion 5/5 5/5  Hip extension 5/5 5/5  Hip abduction    Hip adduction    Hip internal rotation    Hip external rotation 5/5 5/5  Knee flexion 5/5 5/5  Knee extension 5/5 5/5  Ankle dorsiflexion    Ankle plantarflexion    Ankle inversion    Ankle eversion     (Blank rows = not tested)  LUMBAR SPECIAL TESTS:  Supine SLR test: negative bilat  SPINAL SEGMENTAL MOBILITY ASSESSMENT:  No tenderness with palpation t/o lumbar spine including SP's and bilat lumbar flanks.  Pt has minimal to low moderate tightness in bilat lumbar paraspinals  GAIT: Assistive device utilized: None Level of assistance: Complete Independence Comments: Pt ambulates with a normalized heel to toe gait pattern without limping.  Non-antalgic gait    TODAY'S TREATMENT  See below for pt  education   PATIENT EDUCATION:  Education details: dx, relevant anatomy, POC, and objective findings.  Educated pt in importance of maintaining correct posture.  Used a spinal model for educating anatomy and spinal movement.  Person educated: Patient Education method: Explanation and spine model Education comprehension: verbalized understanding   HOME EXERCISE PROGRAM: Will give next visit.  ASSESSMENT:  CLINICAL IMPRESSION: Patient is a 55 y.o. male with a dx of Lumbar degenerative disc disease who received good relief from IM injection and prednisone.  Pt states he is not having much pain now though continues to feel a pinch in his L sided lumbar. MD ordered PT including aquatic therapy.  Pt has good lumbar AROM t/o except he does feel a pinch with extension and L SB'ing.  Pt has good strength in bilat hips and knees.  Pt has increased pain with work activities though is not performing work activities due to recent L wrist fracture.  He has occasional glute/hip pain with sitting and was having pain with his workout activities.  Objective impairments include decreased activity tolerance, decreased strength, impaired flexibility, and pain. These impairments are limiting patient from occupation and workout activities . Personal factors including obesity and current L wrist fracture are also affecting patient's functional outcome. Patient should benefit from skilled PT to address above impairments and improve overall function.  REHAB POTENTIAL: Good  CLINICAL DECISION MAKING: Stable/uncomplicated  EVALUATION COMPLEXITY: Low   GOALS:   SHORT TERM GOALS:  STG Name Target Date Goal status  1 Pt will be independent and compliant with HEP for improved pain, core strength, and function.  Baseline:  10/16/2021 INITIAL  2 Pt will report at least a 25% overall improvement in the "pinching" pain. Baseline:  10/16/2021 INITIAL                            LONG TERM GOALS:   LTG Name  Target Date Goal status  1 Pt will demo improved core strength by progressing with core exercises without adverse effects for improved tolerance to daily activities and work activities.  Baseline: 11/06/2021 INITIAL  2 Pt will demo proper form with hip hinge/squat to lift technique to improve his ability to lift his equipment at work (when able due to his wrist) with correct body mechanics to reduce strain and stress on lumbar.  Baseline: 11/06/2021 INITIAL  3 Pt will report he is able to perform his daily activities and daily mobility without having the significant pinching pain.  Baseline: 11/06/2021 INITIAL                       PLAN: PT FREQUENCY: 1x/week  PT DURATION: other: 4-6 weeks  PLANNED INTERVENTIONS: Therapeutic exercises, Therapeutic activity, Neuro Muscular re-education, Patient/Family education, Joint mobilization, Aquatic Therapy, Dry Needling, Electrical stimulation, Spinal mobilization, Cryotherapy, Moist heat, Taping, Ultrasound, and Manual therapy  PLAN FOR NEXT SESSION: check LLD.  Manual techniques including PA's and neutral gapping jt mobs.  Cont with core strengthening and hip flexibility.     Audie Clear III PT, DPT 09/26/21 12:25 AM

## 2021-09-30 ENCOUNTER — Ambulatory Visit (HOSPITAL_BASED_OUTPATIENT_CLINIC_OR_DEPARTMENT_OTHER): Payer: Managed Care, Other (non HMO) | Admitting: Physical Therapy

## 2021-10-02 ENCOUNTER — Ambulatory Visit (HOSPITAL_BASED_OUTPATIENT_CLINIC_OR_DEPARTMENT_OTHER): Payer: Managed Care, Other (non HMO) | Admitting: Physical Therapy

## 2021-10-02 ENCOUNTER — Other Ambulatory Visit: Payer: Self-pay

## 2021-10-02 ENCOUNTER — Encounter (HOSPITAL_BASED_OUTPATIENT_CLINIC_OR_DEPARTMENT_OTHER): Payer: Self-pay | Admitting: Physical Therapy

## 2021-10-02 DIAGNOSIS — M545 Low back pain, unspecified: Secondary | ICD-10-CM

## 2021-10-02 NOTE — Therapy (Signed)
OUTPATIENT PHYSICAL THERAPY TREATMENT NOTE   Patient Name: Benjamin Singleton MRN: [id number] DOB:[birth year], 55 y.o., male Today's Date: 10/02/2021  PCP: Sunnie Nielsen, DO REFERRING PROVIDER: Sunnie Nielsen, DO   PT End of Session - 10/02/21 1434     Visit Number 2    Number of Visits 7    Date for PT Re-Evaluation 11/06/21    PT Start Time 1435    PT Stop Time 1515    PT Time Calculation (min) 40 min    Activity Tolerance Patient tolerated treatment well    Behavior During Therapy [facility] for tasks assessed/performed             Past Medical History:  Diagnosis Date   Gastritis 06/14/2015   GERD (gastroesophageal reflux disease) 06/14/2015   Hypertension 05/24/2019   Sleep apnea    History reviewed. No pertinent surgical history. Patient Active Problem List   Diagnosis Date Noted   Lumbar degenerative disc disease 09/02/2021   Hypertension 05/24/2019   Actinic keratosis 05/04/2019   Acute pain of left shoulder 03/23/2017   Constipation 12/10/2015   Gastritis 06/14/2015   GERD (gastroesophageal reflux disease) 06/14/2015   Obese 06/14/2015   Snores 06/14/2015    REFERRING DIAG: M51.36 (ICD-10-CM) - Lumbar degenerative disc disease   THERAPY DIAG:  Low back pain, unspecified back pain laterality, unspecified chronicity, unspecified whether sciatica present  PERTINENT HISTORY: Obesity, current L wrist fracture  PRECAUTIONS:  current L wrist fracture  SUBJECTIVE: Pt states he did well after last session. He states the pinching is better. He is still out of work for 2 weeks due to L wrist fx. He is trying to go back to the gym and trying to workout, but still has some minor back pain.    Pt to see MD Nov 18 about wrist.  PAIN:  Are you having pain? 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  He is not working currently due to L wrist.     PLOF: Independent.  Pt tries to workout 1-2 days per  week.  Pt was able to perform work activities without significant back pain.     PATIENT GOALS to be pain free.  To get rid of the pinch he feels.  OBJECTIVE:  R side hip elevation in standing  TODAY'S TREATMENT: TODAY'S TREATMENT  STM: R QL, paraspinals  Joint mobs: L1-5 UPA Grade III-IV, L1-5 CPA grade IV  LTR 5s 10x each Seated QL stretch 30s 3x Seated flexion stretch 10s 10x with rolling stool Seated figure 4 stretch 30s 3x      PATIENT EDUCATION:  Education details: safety with gym exercise, anatomy, exercise progression, DOMS expectations, muscle firing,  envelope of function, HEP, POC Person educated: Patient Education method: Explanation and spine model Education comprehension: verbalized understanding     HOME EXERCISE PROGRAM:    ASSESSMENT:   CLINICAL IMPRESSION: Pt with improved L/S SB and ext ROM following manual therapy at today's session. Pt with no pinching sensation during exercise. Pt's sx appear consistent today with R sided closing restriction and paraspinal and QL hypertonicity. Pt given HEP to improve hip and lumbar mobility. Plan to continue with reducing lumbar stiffness improving core/trunk stability as tolerated. Pt is currently limited with certain exercises due to L wrist fx. Pt would benefit from continued skilled therapy in order to reach goals and maximize functional lumbopelvic strength and ROM for full return to  PLOF.   Objective impairments include decreased activity tolerance, decreased strength, impaired flexibility, and pain. These impairments are limiting patient from occupation and workout activities . Personal factors including obesity and current L wrist fracture are also affecting patient's functional outcome.   REHAB POTENTIAL: Good   CLINICAL DECISION MAKING: Stable/uncomplicated   EVALUATION COMPLEXITY: Low     GOALS:     SHORT TERM GOALS:   STG Name Target Date Goal status  1 Pt will be independent and compliant with HEP  for improved pain, core strength, and function.  Baseline:  10/16/2021 INITIAL  2 Pt will report at least a 25% overall improvement in the "pinching" pain. Baseline:  10/16/2021 INITIAL                                                 LONG TERM GOALS:    LTG Name Target Date Goal status  1 Pt will demo improved core strength by progressing with core exercises without adverse effects for improved tolerance to daily activities and work activities.  Baseline: 11/06/2021 INITIAL  2 Pt will demo proper form with hip hinge/squat to lift technique to improve his ability to lift his equipment at work (when able due to his wrist) with correct body mechanics to reduce strain and stress on lumbar.  Baseline: 11/06/2021 INITIAL  3 Pt will report he is able to perform his daily activities and daily mobility without having the significant pinching pain.  Baseline: 11/06/2021 INITIAL                                        PLAN: PT FREQUENCY: 1x/week   PT DURATION: other: 4-6 weeks   PLANNED INTERVENTIONS: Therapeutic exercises, Therapeutic activity, Neuro Muscular re-education, Patient/Family education, Joint mobilization, Aquatic Therapy, Dry Needling, Electrical stimulation, Spinal mobilization, Cryotherapy, Moist heat, Taping, Ultrasound, and Manual therapy   PLAN FOR NEXT SESSION:   Manual techniques including PA's and neutral gapping jt mobs. DN PRN?,  Cont with core strengthening and hip flexibility.       Zebedee Iba PT, DPT 10/02/21 3:05 PM

## 2021-10-09 ENCOUNTER — Encounter (HOSPITAL_BASED_OUTPATIENT_CLINIC_OR_DEPARTMENT_OTHER): Payer: Managed Care, Other (non HMO) | Admitting: Physical Therapy

## 2021-10-13 ENCOUNTER — Ambulatory Visit (HOSPITAL_BASED_OUTPATIENT_CLINIC_OR_DEPARTMENT_OTHER): Payer: Managed Care, Other (non HMO) | Admitting: Physical Therapy

## 2021-10-14 ENCOUNTER — Ambulatory Visit: Payer: Managed Care, Other (non HMO) | Admitting: Sports Medicine

## 2021-10-15 ENCOUNTER — Encounter (HOSPITAL_BASED_OUTPATIENT_CLINIC_OR_DEPARTMENT_OTHER): Payer: Managed Care, Other (non HMO) | Admitting: Physical Therapy

## 2021-10-23 ENCOUNTER — Encounter (HOSPITAL_BASED_OUTPATIENT_CLINIC_OR_DEPARTMENT_OTHER): Payer: Managed Care, Other (non HMO) | Admitting: Physical Therapy

## 2021-10-29 ENCOUNTER — Other Ambulatory Visit: Payer: Self-pay

## 2021-10-29 ENCOUNTER — Encounter (HOSPITAL_BASED_OUTPATIENT_CLINIC_OR_DEPARTMENT_OTHER): Payer: Self-pay | Admitting: Physical Therapy

## 2021-10-29 ENCOUNTER — Ambulatory Visit (HOSPITAL_BASED_OUTPATIENT_CLINIC_OR_DEPARTMENT_OTHER): Payer: Managed Care, Other (non HMO) | Attending: Sports Medicine | Admitting: Physical Therapy

## 2021-10-29 DIAGNOSIS — M545 Low back pain, unspecified: Secondary | ICD-10-CM | POA: Insufficient documentation

## 2021-10-29 NOTE — Therapy (Signed)
OUTPATIENT PHYSICAL THERAPY TREATMENT NOTE   Patient Name: Benjamin Singleton MRN: [id number] DOB:02/15/1966, 55 y.o., male Today's Date: 10/29/2021  PCP: Sunnie Nielsen, DO REFERRING PROVIDER: Sunnie Nielsen, DO   PT End of Session - 10/29/21 1005     Visit Number 3    Number of Visits 7    Date for PT Re-Evaluation 11/06/21    Authorization Type [employer]    PT Start Time [phone]    PT Stop Time 0930    PT Time Calculation (min) 40 min    Activity Tolerance Patient tolerated treatment well    Behavior During Therapy [facility] for tasks assessed/performed              Past Medical History:  Diagnosis Date   Gastritis 06/14/2015   GERD (gastroesophageal reflux disease) 06/14/2015   Hypertension 05/24/2019   Sleep apnea    History reviewed. No pertinent surgical history. Patient Active Problem List   Diagnosis Date Noted   Lumbar degenerative disc disease 09/02/2021   Hypertension 05/24/2019   Actinic keratosis 05/04/2019   Acute pain of left shoulder 03/23/2017   Constipation 12/10/2015   Gastritis 06/14/2015   GERD (gastroesophageal reflux disease) 06/14/2015   Obese 06/14/2015   Snores 06/14/2015    REFERRING DIAG: M51.36 (ICD-10-CM) - Lumbar degenerative disc disease   THERAPY DIAG:  Low back pain, unspecified back pain laterality, unspecified chronicity, unspecified whether sciatica present  PERTINENT HISTORY: Obesity, current L wrist fracture  PRECAUTIONS:  current L wrist fracture  SUBJECTIVE: Pt states he feels less of the pinching but the pain is diminished. He will still have some of the pain with the stair climber. Sitting will still cause pain after a while.   Pt to see MD Nov 18 about wrist.  PAIN:  PAIN:  Are you having pain? 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  He is not working currently due  to L wrist.     PLOF: Independent.  Pt tries to workout 1-2 days per week.  Pt was able to perform work activities without significant back pain.     PATIENT GOALS to be pain free.  To get rid of the pinch he feels.  OBJECTIVE:  R side hip elevation in standing  TODAY'S TREATMENT: TODAY'S TREATMENT  STM: L QL, paraspinals  Joint mobs: L1-5 UPA Grade III-IV, L1-5 CPA grade IV  LTR 5s 10x each Seated pelvic tilt 20x Curl up 2s 2x10 Seated flexion stretch 10s 10x (reviwed) Seated figure 4 stretch 30s 3x (reviewed)      PATIENT EDUCATION:  Education details: wrist healing/brace usage, safety with gym exercise, anatomy, exercise progression, DOMS expectations, muscle firing,  envelope of function, desk ergos, HEP Person educated: Patient Education method: Explanation and spine model Education comprehension: verbalized understanding     HOME EXERCISE PROGRAM: Access Code: [email or web address] URL: [email or web address] Date: 10/29/2021 Prepared by: Zebedee Iba  Exercises Supine Lower Trunk Rotation - 2 x daily - 7 x weekly - 1 sets - 15 reps - 5 hold Seated Quadratus Lumborum Stretch in Chair - 2 x daily - 7 x weekly - 1 sets - 3 reps - 30 hold Seated Figure 4 Piriformis Stretch - 2 x daily - 7 x weekly - 1 sets - 3 reps - 30 hold Seated Flexion Stretch with Swiss Newman Pies -  2 x daily - 7 x weekly - 1 sets - 10 reps - 10 hold Seated Pelvic Tilt - 5-6 x daily - 7 x weekly - 1 sets - 10 reps    ASSESSMENT:   CLINICAL IMPRESSION: Pt with improved L sided closing pattern following manual therapy at today's session. Pt able to demonstrate good form with previous HEP as well introduction of seated pelvic tilting for prevention of static positioning pain. Pt is currently limited with certain exercises due to L wrist fx. However, according to last MD note, is able to take it off for AROM. Pt given edu about fracture healing times as well as maintaining wrist mobility in the mean time  before seeing OT. Pt would benefit from continued skilled therapy in order to reach goals and maximize functional lumbopelvic strength and ROM for full return to PLOF.   Objective impairments include decreased activity tolerance, decreased strength, impaired flexibility, and pain. These impairments are limiting patient from occupation and workout activities . Personal factors including obesity and current L wrist fracture are also affecting patient's functional outcome.   REHAB POTENTIAL: Good   CLINICAL DECISION MAKING: Stable/uncomplicated   EVALUATION COMPLEXITY: Low     GOALS:     SHORT TERM GOALS:   STG Name Target Date Goal status  1 Pt will be independent and compliant with HEP for improved pain, core strength, and function.  Baseline:  10/16/2021 INITIAL  2 Pt will report at least a 25% overall improvement in the "pinching" pain. Baseline:  10/16/2021 INITIAL                                                 LONG TERM GOALS:    LTG Name Target Date Goal status  1 Pt will demo improved core strength by progressing with core exercises without adverse effects for improved tolerance to daily activities and work activities.  Baseline: 11/06/2021 INITIAL  2 Pt will demo proper form with hip hinge/squat to lift technique to improve his ability to lift his equipment at work (when able due to his wrist) with correct body mechanics to reduce strain and stress on lumbar.  Baseline: 11/06/2021 INITIAL  3 Pt will report he is able to perform his daily activities and daily mobility without having the significant pinching pain.  Baseline: 11/06/2021 INITIAL                                        PLAN: PT FREQUENCY: 1x/week   PT DURATION: other: 4-6 weeks   PLANNED INTERVENTIONS: Therapeutic exercises, Therapeutic activity, Neuro Muscular re-education, Patient/Family education, Joint mobilization, Aquatic Therapy, Dry Needling, Electrical stimulation, Spinal mobilization,  Cryotherapy, Moist heat, Taping, Ultrasound, and Manual therapy   PLAN FOR NEXT SESSION:   Manual techniques including PA's and neutral gapping jt mobs. DN PRN?,  Cont with core strengthening and hip flexibility.       Zebedee Iba PT, DPT 10/29/21 10:27 AM

## 2021-10-31 ENCOUNTER — Encounter (HOSPITAL_BASED_OUTPATIENT_CLINIC_OR_DEPARTMENT_OTHER): Payer: Managed Care, Other (non HMO) | Admitting: Physical Therapy

## 2021-11-04 ENCOUNTER — Other Ambulatory Visit (HOSPITAL_BASED_OUTPATIENT_CLINIC_OR_DEPARTMENT_OTHER): Payer: Self-pay

## 2021-11-04 ENCOUNTER — Ambulatory Visit (HOSPITAL_BASED_OUTPATIENT_CLINIC_OR_DEPARTMENT_OTHER): Payer: Managed Care, Other (non HMO) | Admitting: Physical Therapy

## 2021-11-04 ENCOUNTER — Encounter (HOSPITAL_BASED_OUTPATIENT_CLINIC_OR_DEPARTMENT_OTHER): Payer: Self-pay | Admitting: Physical Therapy

## 2021-11-04 ENCOUNTER — Other Ambulatory Visit: Payer: Self-pay

## 2021-11-04 DIAGNOSIS — M545 Low back pain, unspecified: Secondary | ICD-10-CM | POA: Diagnosis not present

## 2021-11-04 MED ORDER — INFLUENZA VAC SPLIT QUAD 0.5 ML IM SUSY
PREFILLED_SYRINGE | INTRAMUSCULAR | 0 refills | Status: DC
  Filled 2021-11-04: qty 0.5, 1d supply, fill #0

## 2021-11-04 NOTE — Therapy (Signed)
OUTPATIENT PHYSICAL THERAPY TREATMENT NOTE   Patient Name: Benjamin Singleton MRN: [id number] DOB:March 13, 1966, 55 y.o., male Today's Date: 11/04/2021  PCP: Sunnie Nielsen, DO REFERRING PROVIDER: Sunnie Nielsen, DO   PT End of Session - 11/04/21 [phone]     Visit Number 4    Number of Visits 7    Date for PT Re-Evaluation 11/06/21    Authorization Type [employer]    PT Start Time 0845    PT Stop Time 0925    PT Time Calculation (min) 40 min    Activity Tolerance Patient tolerated treatment well    Behavior During Therapy [facility] for tasks assessed/performed               Past Medical History:  Diagnosis Date   Gastritis 06/14/2015   GERD (gastroesophageal reflux disease) 06/14/2015   Hypertension 05/24/2019   Sleep apnea    History reviewed. No pertinent surgical history. Patient Active Problem List   Diagnosis Date Noted   Lumbar degenerative disc disease 09/02/2021   Hypertension 05/24/2019   Actinic keratosis 05/04/2019   Acute pain of left shoulder 03/23/2017   Constipation 12/10/2015   Gastritis 06/14/2015   GERD (gastroesophageal reflux disease) 06/14/2015   Obese 06/14/2015   Snores 06/14/2015    REFERRING DIAG: M51.36 (ICD-10-CM) - Lumbar degenerative disc disease   THERAPY DIAG:  Low back pain, unspecified back pain laterality, unspecified chronicity, unspecified whether sciatica present  PERTINENT HISTORY: Obesity, current L wrist fracture  PRECAUTIONS:  current L wrist fracture  SUBJECTIVE: Pt states he felt good after last session. He has only had 1-2x of pinching on L side of the back. Pt does notice some numbness into the R toes.    Pt to see MD Nov 18 about wrist.  PAIN:  PAIN:  Are you having pain? 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  He has to carry and set up equipment.  He is not working currently due to L wrist.     PLOF:  Independent.  Pt tries to workout 1-2 days per week.  Pt was able to perform work activities without significant back pain.     PATIENT GOALS to be pain free.  To get rid of the pinch he feels.  OBJECTIVE:  R side hip elevation in standing  TODAY'S TREATMENT: TODAY'S TREATMENT   Joint mobs: L1-5 UPA Grade III-IV, L1-5 CPA grade IV  Cat camel 10x 2s hold, gentle pressure through L UE Seated pelvic tilt 20x Seated QL 30s 2x Curl up 2s 2x10 Standing segmental flexion 5x Self massage with TB at wall 3 min 15lb KB RDL 10x      PATIENT EDUCATION:  Education details: safety with gym exercise, anatomy, exercise progression, DOMS expectations, muscle firing,  envelope of function, desk ergos, HEP Person educated: Patient Education method: Explanation and spine model Education comprehension: verbalized understanding     HOME EXERCISE PROGRAM: Access Code: [email or web address] URL: [email or web address] Date: 10/29/2021 Prepared by: Zebedee Iba  Exercises Supine Lower Trunk Rotation - 2 x daily - 7 x weekly - 1 sets - 15 reps - 5 hold Seated Quadratus Lumborum Stretch in Chair - 2 x daily - 7 x weekly - 1 sets - 3 reps - 30 hold Seated Figure 4 Piriformis Stretch - 2 x daily - 7 x weekly - 1 sets - 3 reps - 30 hold Seated Flexion  Stretch with [employer] - 2 x daily - 7 x weekly - 1 sets - 10 reps - 10 hold Seated Pelvic Tilt - 5-6 x daily - 7 x weekly - 1 sets - 10 reps    ASSESSMENT:   CLINICAL IMPRESSION: Pt with improvement in LBP since previous session. Pt with less joint stiffness today as well as ability to progress lumbar mobility and lifting. Pt with good RDL technique and advised to begin light RDL/DL motions back in the gym. Pt to decrease frequency at this time in order to facilitate independent strength progression. Pt would benefit from continued skilled therapy in order to reach goals and maximize functional lumbopelvic strength and ROM for full return to  PLOF.   Objective impairments include decreased activity tolerance, decreased strength, impaired flexibility, and pain. These impairments are limiting patient from occupation and workout activities . Personal factors including obesity and current L wrist fracture are also affecting patient's functional outcome.   REHAB POTENTIAL: Good   CLINICAL DECISION MAKING: Stable/uncomplicated   EVALUATION COMPLEXITY: Low     GOALS:     SHORT TERM GOALS:   STG Name Target Date Goal status  1 Pt will be independent and compliant with HEP for improved pain, core strength, and function.  Baseline:  10/16/2021 INITIAL  2 Pt will report at least a 25% overall improvement in the "pinching" pain. Baseline:  10/16/2021 INITIAL                                                 LONG TERM GOALS:    LTG Name Target Date Goal status  1 Pt will demo improved core strength by progressing with core exercises without adverse effects for improved tolerance to daily activities and work activities.  Baseline: 11/06/2021 INITIAL  2 Pt will demo proper form with hip hinge/squat to lift technique to improve his ability to lift his equipment at work (when able due to his wrist) with correct body mechanics to reduce strain and stress on lumbar.  Baseline: 11/06/2021 INITIAL  3 Pt will report he is able to perform his daily activities and daily mobility without having the significant pinching pain.  Baseline: 11/06/2021 INITIAL                                        PLAN: PT FREQUENCY: 1x/week   PT DURATION: other: 4-6 weeks   PLANNED INTERVENTIONS: Therapeutic exercises, Therapeutic activity, Neuro Muscular re-education, Patient/Family education, Joint mobilization, Aquatic Therapy, Dry Needling, Electrical stimulation, Spinal mobilization, Cryotherapy, Moist heat, Taping, Ultrasound, and Manual therapy   PLAN FOR NEXT SESSION:  Cont with core strengthening and hip flexibility, DN PRN     Zebedee Iba PT,  DPT 11/04/21 9:31 AM

## 2021-11-07 ENCOUNTER — Other Ambulatory Visit (HOSPITAL_BASED_OUTPATIENT_CLINIC_OR_DEPARTMENT_OTHER): Payer: Self-pay

## 2021-12-09 ENCOUNTER — Ambulatory Visit: Payer: Managed Care, Other (non HMO) | Attending: Internal Medicine

## 2021-12-09 DIAGNOSIS — Z23 Encounter for immunization: Secondary | ICD-10-CM

## 2021-12-10 ENCOUNTER — Other Ambulatory Visit (HOSPITAL_BASED_OUTPATIENT_CLINIC_OR_DEPARTMENT_OTHER): Payer: Self-pay

## 2021-12-10 MED ORDER — PFIZER COVID-19 VAC BIVALENT 30 MCG/0.3ML IM SUSP
INTRAMUSCULAR | 0 refills | Status: DC
  Filled 2021-12-10: qty 0.3, 1d supply, fill #0

## 2021-12-10 NOTE — Progress Notes (Signed)
° °  Covid-19 Vaccination Clinic  Name:  Benjamin Singleton    MRN: [id number] DOB: [birth year]  12/10/2021  Mr. Benjamin Singleton was observed post Covid-19 immunization for 15 minutes without incident. He was provided with Vaccine Information Sheet and instruction to access the V-Safe system.   Mr. Benjamin Singleton was instructed to call 911 with any severe reactions post vaccine: Difficulty breathing  Swelling of face and throat  A fast heartbeat  A bad rash all over body  Dizziness and weakness   Immunizations Administered     Name Date Dose VIS Date Route   Pfizer Covid-19 Vaccine Bivalent Booster 12/09/2021  4:12 PM 0.3 mL 07/23/2021 Intramuscular   Manufacturer: [employer], [employer]   Lot: [id number]   NDC: [id number]

## 2021-12-15 ENCOUNTER — Telehealth: Payer: Managed Care, Other (non HMO) | Admitting: Physician Assistant

## 2021-12-15 DIAGNOSIS — U071 COVID-19: Secondary | ICD-10-CM

## 2021-12-15 MED ORDER — BENZONATATE 100 MG PO CAPS: 100.0000 mg | ORAL_CAPSULE | Freq: Three times a day (TID) | ORAL | 0 refills | Status: DC | PRN

## 2021-12-15 MED ORDER — LIDOCAINE VISCOUS HCL 2 % MT SOLN: OROMUCOSAL | 0 refills | Status: DC

## 2021-12-15 MED ORDER — MOLNUPIRAVIR EUA 200MG CAPSULE: 4.0000 | ORAL_CAPSULE | Freq: Two times a day (BID) | ORAL | 0 refills | Status: AC

## 2021-12-15 MED ORDER — FLUTICASONE PROPIONATE 50 MCG/ACT NA SUSP
2.0000 | Freq: Every day | NASAL | 0 refills | Status: DC
Start: 2021-12-15 — End: 2022-02-12

## 2021-12-15 NOTE — Patient Instructions (Signed)
[employer], thank you for joining Margaretann Loveless, PA-C for today's virtual visit.  While this provider is not your primary care provider (PCP), if your PCP is located in our provider database this encounter information will be shared with them immediately following your visit.  Consent: (Patient) Benjamin Singleton provided verbal consent for this virtual visit at the beginning of the encounter.  Current Medications:  Current Outpatient Medications:    benzonatate (TESSALON) 100 MG capsule, Take 1 capsule (100 mg total) by mouth 3 (three) times daily as needed., Disp: 30 capsule, Rfl: 0   fluticasone (FLONASE) 50 MCG/ACT nasal spray, Place 2 sprays into both nostrils daily., Disp: 16 g, Rfl: 0   lidocaine (XYLOCAINE) 2 % solution, 5 mL swish and swallow every 4 hours as needed, Disp: 100 mL, Rfl: 0   molnupiravir EUA (LAGEVRIO) 200 mg CAPS capsule, Take 4 capsules (800 mg total) by mouth 2 (two) times daily for 5 days., Disp: 40 capsule, Rfl: 0   COVID-19 mRNA bivalent vaccine, Pfizer, (PFIZER COVID-19 VAC BIVALENT) injection, Inject into the muscle., Disp: 0.3 mL, Rfl: 0   COVID-19 mRNA Vac-TriS, Pfizer, (PFIZER-BIONT COVID-19 VAC-TRIS) SUSP injection, Inject into the muscle., Disp: 0.3 mL, Rfl: 0   Homeopathic Products (ARNICARE ARNICA) CREA, Apply topically., Disp: , Rfl:    influenza vac split quadrivalent PF (FLUARIX) 0.5 ML injection, Inject into the muscle., Disp: 0.5 mL, Rfl: 0   Multiple Vitamin (MULTIVITAMIN WITH MINERALS) TABS tablet, Take 1 tablet by mouth daily., Disp: , Rfl:    predniSONE (DELTASONE) 50 MG tablet, One tab PO daily for 5 days., Disp: 5 tablet, Rfl: 0   Medications ordered in this encounter:  Meds ordered this encounter  Medications   molnupiravir EUA (LAGEVRIO) 200 mg CAPS capsule    Sig: Take 4 capsules (800 mg total) by mouth 2 (two) times daily for 5 days.    Dispense:  40 capsule    Refill:  0    Order Specific Question:   Supervising Provider     Answer:   MILLER, BRIAN [[id number]]   benzonatate (TESSALON) 100 MG capsule    Sig: Take 1 capsule (100 mg total) by mouth 3 (three) times daily as needed.    Dispense:  30 capsule    Refill:  0    Order Specific Question:   Supervising Provider    Answer:   MILLER, BRIAN [[id number]]   fluticasone (FLONASE) 50 MCG/ACT nasal spray    Sig: Place 2 sprays into both nostrils daily.    Dispense:  16 g    Refill:  0    Order Specific Question:   Supervising Provider    Answer:   MILLER, BRIAN [[id number]]   lidocaine (XYLOCAINE) 2 % solution    Sig: 5 mL swish and swallow every 4 hours as needed    Dispense:  100 mL    Refill:  0    Order Specific Question:   Supervising Provider    Answer:   Eber Hong [[id number]]     *If you need refills on other medications prior to your next appointment, please contact your pharmacy*  Follow-Up: Call back or seek an in-person evaluation if the symptoms worsen or if the condition fails to improve as anticipated.  Other Instructions Molnupiravir Oral Capsules What is this medication? MOLNUPIRAVIR (mol nue pir a vir) treats COVID-19. It is an antiviral medication. It may decrease the risk of developing severe symptoms of COVID-19. It may also decrease the chance of  going to the hospital. This medication is not approved by the FDA. The FDA has authorized emergency use of this medication during the COVID-19 pandemic. This medicine may be used for other purposes; ask your health care provider or pharmacist if you have questions. COMMON BRAND NAME(S): LAGEVRIO What should I tell my care team before I take this medication? They need to know if you have any of these conditions: Any allergies Any serious illness An unusual or allergic reaction to molnupiravir, other medications, foods, dyes, or preservatives Pregnant or trying to get pregnant Breast-feeding How should I use this medication? Take this medication by mouth with water. Take it as directed on the prescription  label at the same time every day. Do not cut, crush or chew this medication. Swallow the capsules whole. You can take it with or without food. If it upsets your stomach, take it with food. Take all of this medication unless your care team tells you to stop it early. Keep taking it even if you think you are better. Talk to your care team about the use of this medication in children. Special care may be needed. Overdosage: If you think you have taken too much of this medicine contact a poison control center or emergency room at once. NOTE: This medicine is only for you. Do not share this medicine with others. What if I miss a dose? If you miss a dose, take it as soon as you can unless it is more than 10 hours late. If it is more than 10 hours late, skip the missed dose. Take the next dose at the normal time. Do not take extra or 2 doses at the same time to make up for the missed dose. What may interact with this medication? Interactions have not been studied. This list may not describe all possible interactions. Give your health care provider a list of all the medicines, herbs, non-prescription drugs, or dietary supplements you use. Also tell them if you smoke, drink alcohol, or use illegal drugs. Some items may interact with your medicine. What should I watch for while using this medication? Your condition will be monitored carefully while you are receiving this medication. Visit your care team for regular checkups. Tell your care team if your symptoms do not start to get better or if they get worse. Do not become pregnant while taking this medication. You may need a pregnancy test before starting this medication. Women must use a reliable form of birth control while taking this medication and for 4 days after stopping the medication. Women should inform their care team if they wish to become pregnant or think they might be pregnant. Men should not father a child while taking this medication and for 3  months after stopping it. There is potential for serious harm to an unborn child. Talk to your care team for more information. Do not breast-feed an infant while taking this medication and for 4 days after stopping the medication. What side effects may I notice from receiving this medication? Side effects that you should report to your care team as soon as possible: Allergic reactions--skin rash, itching, hives, swelling of the face, lips, tongue, or throat Side effects that usually do not require medical attention (report these to your care team if they continue or are bothersome): Diarrhea Dizziness Nausea This list may not describe all possible side effects. Call your doctor for medical advice about side effects. You may report side effects to FDA at 1-800-FDA-1088. Where should I  keep my medication? Keep out of the reach of children and pets. Store at room temperature between 20 and 25 degrees C (68 and 77 degrees F). Get rid of any unused medication after the expiration date. To get rid of medications that are no longer needed or have expired: Take the medication to a medication take-back program. Check with your pharmacy or law enforcement to find a location. If you cannot return the medication, check the label or package insert to see if the medication should be thrown out in the garbage or flushed down the toilet. If you are not sure, ask your care team. If it is safe to put it in the trash, take the medication out of the container. Mix the medication with cat litter, dirt, coffee grounds, or other unwanted substance. Seal the mixture in a bag or container. Put it in the trash. NOTE: This sheet is a summary. It may not cover all possible information. If you have questions about this medicine, talk to your doctor, pharmacist, or health care provider.  2022 Elsevier/Gold Standard (2020-11-18 00:00:00)   10 Things You Can Do to Manage Your COVID-19 Symptoms at Home If you have possible or  confirmed COVID-19 Stay home except to get medical care. Monitor your symptoms carefully. If your symptoms get worse, call your healthcare provider immediately. Get rest and stay hydrated. If you have a medical appointment, call the healthcare provider ahead of time and tell them that you have or may have COVID-19. For medical emergencies, call 911 and notify the dispatch personnel that you have or may have COVID-19. Cover your cough and sneezes with a tissue or use the inside of your elbow. Wash your hands often with soap and water for at least 20 seconds or clean your hands with an alcohol-based hand sanitizer that contains at least 60% alcohol. As much as possible, stay in a specific room and away from other people in your home. Also, you should use a separate bathroom, if available. If you need to be around other people in or outside of the home, wear a mask. Avoid sharing personal items with other people in your household, like dishes, towels, and bedding. Clean all surfaces that are touched often, like counters, tabletops, and doorknobs. Use household cleaning sprays or wipes according to the label instructions. [email or web address].[employer].uk[place]c.gov/coronavirus 06/07/2020 This information is not intended to replace advice given to you by your health care provider. Make sure you discuss any questions you have with your health care provider. Document Revised: 08/01/2021 Document Reviewed: 08/01/2021 Elsevier Patient Education  2022 [employer]    If you have been instructed to have an in-person evaluation today at a local Urgent Care facility, please use the link below. It will take you to a list of all of our available [facility], including address, phone number and hours of operation. Please do not delay care.  [facility]  If you or a family member do not have a primary care provider, use the link below to schedule a visit and establish care. When you choose a [place] primary care  physician or advanced practice provider, you gain a long-term partner in health. Find a Primary Care Provider  Learn more about [facility]'s in-office and virtual care options: [place] - Get Care Now

## 2021-12-15 NOTE — Progress Notes (Signed)
Virtual Visit Consent   Benjamin Singleton, you are scheduled for a virtual visit with a [facility] provider today.     Just as with appointments in the office, your consent must be obtained to participate.  Your consent will be active for this visit and any virtual visit you may have with one of our providers in the next 365 days.     If you have a MyChart account, a copy of this consent can be sent to you electronically.  All virtual visits are billed to your insurance company just like a traditional visit in the office.    As this is a virtual visit, video technology does not allow for your provider to perform a traditional examination.  This may limit your provider's ability to fully assess your condition.  If your provider identifies any concerns that need to be evaluated in person or the need to arrange testing (such as labs, EKG, etc.), we will make arrangements to do so.     Although advances in technology are sophisticated, we cannot ensure that it will always work on either your end or our end.  If the connection with a video visit is poor, the visit may have to be switched to a telephone visit.  With either a video or telephone visit, we are not always able to ensure that we have a secure connection.     I need to obtain your verbal consent now.   Are you willing to proceed with your visit today?    [name] has provided verbal consent on 12/15/2021 for a virtual visit (video or telephone).   Margaretann Loveless, PA-C   Date: 12/15/2021 10:43 AM   Virtual Visit via Video Note   I, Margaretann Loveless, connected with  [name]  ([id number], 1966/06/13) on 12/15/21 at 10:45 AM EST by a video-enabled telemedicine application and verified that I am speaking with the correct person using two identifiers.  Location: Patient: Virtual Visit Location Patient: Home Provider: Virtual Visit Location Provider: Home Office   I discussed the limitations of evaluation and management by  telemedicine and the availability of in person appointments. The patient expressed understanding and agreed to proceed.    History of Present Illness: [name] is a 56 y.o. who identifies as a male who was assigned male at birth, and is being seen today for Covid 4.  HPI: URI  This is a new problem. Episode onset: Tested positive for covid 19 on Saturday, 12/13/21; Symptoms started Saturday. The problem has been unchanged. There has been no fever. Associated symptoms include congestion, coughing, headaches, rhinorrhea and a sore throat. Pertinent negatives include no diarrhea, ear pain, nausea, plugged ear sensation, sinus pain, vomiting or wheezing. He has tried acetaminophen, increased fluids and sleep (Coricidin HBP and Mucinex) for the symptoms. The treatment provided moderate relief.     Problems:  Patient Active Problem List   Diagnosis Date Noted   Lumbar degenerative disc disease 09/02/2021   Hypertension 05/24/2019   Actinic keratosis 05/04/2019   Acute pain of left shoulder 03/23/2017   Constipation 12/10/2015   Gastritis 06/14/2015   GERD (gastroesophageal reflux disease) 06/14/2015   Obese 06/14/2015   Snores 06/14/2015    Allergies:  Allergies  Allergen Reactions   Penicillins Rash   Medications:  Current Outpatient Medications:    benzonatate (TESSALON) 100 MG capsule, Take 1 capsule (100 mg total) by mouth 3 (three) times daily as needed., Disp: 30 capsule, Rfl: 0   fluticasone (  FLONASE) 50 MCG/ACT nasal spray, Place 2 sprays into both nostrils daily., Disp: 16 g, Rfl: 0   lidocaine (XYLOCAINE) 2 % solution, 5 mL swish and swallow every 4 hours as needed, Disp: 100 mL, Rfl: 0   molnupiravir EUA (LAGEVRIO) 200 mg CAPS capsule, Take 4 capsules (800 mg total) by mouth 2 (two) times daily for 5 days., Disp: 40 capsule, Rfl: 0   COVID-19 mRNA bivalent vaccine, Pfizer, (PFIZER COVID-19 VAC BIVALENT) injection, Inject into the muscle., Disp: 0.3 mL, Rfl: 0   COVID-19  mRNA Vac-TriS, Pfizer, (PFIZER-BIONT COVID-19 VAC-TRIS) SUSP injection, Inject into the muscle., Disp: 0.3 mL, Rfl: 0   Homeopathic Products (ARNICARE ARNICA) CREA, Apply topically., Disp: , Rfl:    influenza vac split quadrivalent PF (FLUARIX) 0.5 ML injection, Inject into the muscle., Disp: 0.5 mL, Rfl: 0   Multiple Vitamin (MULTIVITAMIN WITH MINERALS) TABS tablet, Take 1 tablet by mouth daily., Disp: , Rfl:    predniSONE (DELTASONE) 50 MG tablet, One tab PO daily for 5 days., Disp: 5 tablet, Rfl: 0  Observations/Objective: Patient is well-developed, well-nourished in no acute distress.  Resting comfortably at home.  Head is normocephalic, atraumatic.  No labored breathing.  Speech is clear and coherent with logical content.  Patient is alert and oriented at baseline.    Assessment and Plan: 1. COVID-19 - molnupiravir EUA (LAGEVRIO) 200 mg CAPS capsule; Take 4 capsules (800 mg total) by mouth 2 (two) times daily for 5 days.  Dispense: 40 capsule; Refill: 0 - benzonatate (TESSALON) 100 MG capsule; Take 1 capsule (100 mg total) by mouth 3 (three) times daily as needed.  Dispense: 30 capsule; Refill: 0 - fluticasone (FLONASE) 50 MCG/ACT nasal spray; Place 2 sprays into both nostrils daily.  Dispense: 16 g; Refill: 0 - lidocaine (XYLOCAINE) 2 % solution; 5 mL swish and swallow every 4 hours as needed  Dispense: 100 mL; Refill: 0 - MyChart COVID-19 home monitoring program; Future  - Continue OTC symptomatic management of choice - Will send OTC vitamins and supplement information through AVS - Molnupiravir, tessalon perles, viscous lidocaine, and flonase prescribed - Patient enrolled in MyChart symptom monitoring - Push fluids - Rest as needed - Discussed return precautions and when to seek in-person evaluation, sent via AVS as well  Follow Up Instructions: I discussed the assessment and treatment plan with the patient. The patient was provided an opportunity to ask questions and all were  answered. The patient agreed with the plan and demonstrated an understanding of the instructions.  A copy of instructions were sent to the patient via MyChart unless otherwise noted below.    The patient was advised to call back or seek an in-person evaluation if the symptoms worsen or if the condition fails to improve as anticipated.  Time:  I spent 13 minutes with the patient via telehealth technology discussing the above problems/concerns.    Margaretann Loveless, PA-C

## 2021-12-16 ENCOUNTER — Encounter (HOSPITAL_BASED_OUTPATIENT_CLINIC_OR_DEPARTMENT_OTHER): Payer: Managed Care, Other (non HMO) | Admitting: Physical Therapy

## 2022-01-02 ENCOUNTER — Other Ambulatory Visit: Payer: Self-pay

## 2022-01-02 ENCOUNTER — Emergency Department (INDEPENDENT_AMBULATORY_CARE_PROVIDER_SITE_OTHER)
Admission: EM | Admit: 2022-01-02 | Discharge: 2022-01-02 | Disposition: A | Discharge status: Home / Self Care | Payer: Self-pay | Source: Home / Self Care

## 2022-01-02 ENCOUNTER — Emergency Department (INDEPENDENT_AMBULATORY_CARE_PROVIDER_SITE_OTHER): Payer: Managed Care, Other (non HMO)

## 2022-01-02 DIAGNOSIS — M542 Cervicalgia: Secondary | ICD-10-CM | POA: Diagnosis not present

## 2022-01-02 DIAGNOSIS — M62838 Other muscle spasm: Secondary | ICD-10-CM

## 2022-01-02 MED ORDER — BACLOFEN 10 MG PO TABS: 10.0000 mg | ORAL_TABLET | Freq: Three times a day (TID) | ORAL | 0 refills | Status: DC

## 2022-01-02 MED ORDER — METHYLPREDNISOLONE 4 MG PO TBPK: ORAL_TABLET | ORAL | 0 refills | Status: DC
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
## 2022-01-02 NOTE — Discharge Instructions (Addendum)
Advised patient to take medication as directed with food to completion.  Advised patient may use Baclofen daily or as needed for accompanying trapezius muscle spasms.  Encouraged patient to increase daily water intake while taking these medications.
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
## 2022-01-05 ENCOUNTER — Emergency Department (INDEPENDENT_AMBULATORY_CARE_PROVIDER_SITE_OTHER): Payer: Managed Care, Other (non HMO)

## 2022-01-05 ENCOUNTER — Other Ambulatory Visit: Payer: Self-pay

## 2022-01-05 ENCOUNTER — Emergency Department (INDEPENDENT_AMBULATORY_CARE_PROVIDER_SITE_OTHER)
Admission: EM | Admit: 2022-01-05 | Discharge: 2022-01-05 | Disposition: A | Discharge status: Home / Self Care | Payer: Self-pay | Source: Home / Self Care | Attending: Family Medicine | Admitting: Family Medicine

## 2022-01-05 ENCOUNTER — Encounter: Payer: Self-pay | Admitting: Emergency Medicine

## 2022-01-05 DIAGNOSIS — M25561 Pain in right knee: Secondary | ICD-10-CM
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
## 2022-01-05 NOTE — Discharge Instructions (Signed)
Finish the methylprednisolone Dosepak prescribed by Jearld Lesch This should help take down pain and swelling of your knee After you are done with the steroid, take ibuprofen 600 mg to 800 mg for pain.  You may take this 2-3 times a day with food Follow-up with sports medicine if you fail to improve

## 2022-02-12 ENCOUNTER — Other Ambulatory Visit: Payer: Self-pay

## 2022-02-12 ENCOUNTER — Ambulatory Visit (INDEPENDENT_AMBULATORY_CARE_PROVIDER_SITE_OTHER): Payer: Managed Care, Other (non HMO) | Admitting: Family Medicine

## 2022-02-12 ENCOUNTER — Encounter: Payer: Self-pay | Admitting: Family Medicine

## 2022-02-12 VITALS — BP 137/84 | HR 76 | Resp 20 | Ht 70.0 in | Wt 275.1 lb

## 2022-02-12 DIAGNOSIS — R7303 Prediabetes: Secondary | ICD-10-CM | POA: Diagnosis not present

## 2022-02-12 DIAGNOSIS — Z125 Encounter for screening for malignant neoplasm of prostate: Secondary | ICD-10-CM | POA: Diagnosis not present

## 2022-02-12 DIAGNOSIS — Z13220 Encounter for screening for lipoid disorders: Secondary | ICD-10-CM

## 2022-02-12 DIAGNOSIS — I10 Essential (primary) hypertension: Secondary | ICD-10-CM | POA: Diagnosis not present

## 2022-02-12 DIAGNOSIS — M25561 Pain in right knee: Secondary | ICD-10-CM

## 2022-02-12 MED ORDER — MELOXICAM 15 MG PO TABS: 15.0000 mg | ORAL_TABLET | Freq: Every day | ORAL | 0 refills | Status: DC

## 2022-02-12 NOTE — Patient Instructions (Signed)
Try meloxicam for the knee over the next 2 weeks.  ?We'll be in touch with lab results.  ?

## 2022-02-13 LAB — CBC WITH DIFFERENTIAL/PLATELET
Absolute Monocytes: 691 cells/uL (ref 200–950)
Basophils Absolute: 38 cells/uL (ref 0–200)
Basophils Relative: 0.6 %
Eosinophils Absolute: 160 cells/uL (ref 15–500)
Eosinophils Relative: 2.5 %
HCT: 46.9 % (ref 38.5–50.0)
Hemoglobin: 15.5 g/dL (ref 13.2–17.1)
Lymphs Abs: 1856 cells/uL (ref 850–3900)
MCH: 29.6 pg (ref 27.0–33.0)
MCHC: 33 g/dL (ref 32.0–36.0)
MCV: 89.7 fL (ref 80.0–100.0)
MPV: 10.8 fL (ref 7.5–12.5)
Monocytes Relative: 10.8 %
Neutro Abs: 3654 cells/uL (ref 1500–7800)
Neutrophils Relative %: 57.1 %
Platelets: 238 10*3/uL (ref 140–400)
RBC: 5.23 10*6/uL (ref 4.20–5.80)
RDW: 12.9 % (ref 11.0–15.0)
Total Lymphocyte: 29 %
WBC: 6.4 10*3/uL (ref 3.8–10.8)

## 2022-02-13 LAB — COMPLETE METABOLIC PANEL WITH GFR
AG Ratio: 1.8 (calc) (ref 1.0–2.5)
ALT: 20 U/L (ref 9–46)
AST: 16 U/L (ref 10–35)
Albumin: 4.3 g/dL (ref 3.6–5.1)
Alkaline phosphatase (APISO): 65 U/L (ref 35–144)
BUN: 14 mg/dL (ref 7–25)
CO2: 30 mmol/L (ref 20–32)
Calcium: 9.3 mg/dL (ref 8.6–10.3)
Chloride: 102 mmol/L (ref 98–110)
Creat: 0.76 mg/dL (ref 0.70–1.30)
Globulin: 2.4 g/dL (calc) (ref 1.9–3.7)
Glucose, Bld: 119 mg/dL (ref 65–139)
Potassium: 4 mmol/L (ref 3.5–5.3)
Sodium: 139 mmol/L (ref 135–146)
Total Bilirubin: 0.4 mg/dL (ref 0.2–1.2)
Total Protein: 6.7 g/dL (ref 6.1–8.1)
eGFR: 105 mL/min/{1.73_m2} (ref >=60)

## 2022-02-13 LAB — LIPID PANEL W/REFLEX DIRECT LDL
Cholesterol: 156 mg/dL (ref <200)
HDL: 46 mg/dL (ref >=40)
LDL Cholesterol (Calc): 92 mg/dL (calc)
Non-HDL Cholesterol (Calc): 110 mg/dL (calc) (ref <130)
Total CHOL/HDL Ratio: 3.4 (calc) (ref <5.0)
Triglycerides: 88 mg/dL (ref <150)

## 2022-02-13 LAB — PSA: PSA: 1.25 ng/mL (ref <=4.00)

## 2022-02-13 LAB — HEMOGLOBIN A1C
Hgb A1c MFr Bld: 5.9 % of total Hgb — ABNORMAL HIGH (ref <5.7)
Mean Plasma Glucose: 123 mg/dL
eAG (mmol/L): 6.8 mmol/L

## 2022-02-15 DIAGNOSIS — R7303 Prediabetes: Secondary | ICD-10-CM | POA: Insufficient documentation

## 2022-02-15 DIAGNOSIS — M1712 Unilateral primary osteoarthritis, left knee: Secondary | ICD-10-CM | POA: Insufficient documentation

## 2022-02-15 DIAGNOSIS — M25561 Pain in right knee: Secondary | ICD-10-CM | POA: Insufficient documentation

## 2022-02-15 NOTE — Assessment & Plan Note (Signed)
Previously elevated blood sugar.  Updating A1c. ?

## 2022-02-15 NOTE — Assessment & Plan Note (Signed)
Blood pressures fairly well controlled without medication.  We will continue to have him monitor at home as well as clinically at follow-up appointments.  Low-sodium diet encouraged. ?
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
## 2022-07-07 ENCOUNTER — Ambulatory Visit (INDEPENDENT_AMBULATORY_CARE_PROVIDER_SITE_OTHER): Payer: Managed Care, Other (non HMO)

## 2022-07-07 ENCOUNTER — Ambulatory Visit: Payer: Managed Care, Other (non HMO) | Admitting: Sports Medicine

## 2022-07-07 ENCOUNTER — Encounter: Payer: Self-pay | Admitting: Sports Medicine

## 2022-07-07 DIAGNOSIS — M79671 Pain in right foot: Secondary | ICD-10-CM

## 2022-07-07 DIAGNOSIS — M25571 Pain in right ankle and joints of right foot: Secondary | ICD-10-CM | POA: Insufficient documentation

## 2022-07-07 DIAGNOSIS — M84374A Stress fracture, right foot, initial encounter for fracture: Secondary | ICD-10-CM | POA: Insufficient documentation

## 2022-07-07 DIAGNOSIS — A084 Viral intestinal infection, unspecified: Secondary | ICD-10-CM | POA: Diagnosis not present

## 2022-07-07 DIAGNOSIS — Q742 Other congenital malformations of lower limb(s), including pelvic girdle: Secondary | ICD-10-CM | POA: Insufficient documentation
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
## 2022-07-07 NOTE — Assessment & Plan Note (Signed)
Couple of days history of nausea, vomiting, diarrhea, no melena, hematochezia, hematemesis. Symptoms have for the most part resolved, we did discuss a clear liquid diet followed by a soft diet, he declines medications, information given, return to see me if develops any of the above red flag symptoms.

## 2022-08-03 ENCOUNTER — Encounter: Payer: Self-pay | Admitting: Sports Medicine

## 2022-08-03 ENCOUNTER — Ambulatory Visit (INDEPENDENT_AMBULATORY_CARE_PROVIDER_SITE_OTHER): Payer: Managed Care, Other (non HMO)

## 2022-08-03 ENCOUNTER — Ambulatory Visit: Payer: Managed Care, Other (non HMO) | Admitting: Sports Medicine

## 2022-08-03 DIAGNOSIS — T148XXA Other injury of unspecified body region, initial encounter: Secondary | ICD-10-CM | POA: Diagnosis not present

## 2022-08-03 DIAGNOSIS — M25422 Effusion, left elbow: Secondary | ICD-10-CM | POA: Diagnosis not present

## 2022-08-03 DIAGNOSIS — M79671 Pain in right foot: Secondary | ICD-10-CM | POA: Diagnosis not present

## 2022-08-03 DIAGNOSIS — A084 Viral intestinal infection, unspecified: Secondary | ICD-10-CM

## 2022-08-03 NOTE — Progress Notes (Signed)
    Procedures performed today:    None.  Independent interpretation of notes and tests performed by another provider:   None.  Brief History, Exam, Impression, and Recommendations:    Abrasion of skin Pleasant 56 year old male, recently had a fall, he ended up with abrasions on his left elbow, left fifth finger, left knee. He does have some swelling posterior/lateral elbow but good motion and good strength, we will do an x-ray, abrasions on the knee and finger are superficial, we added some antibiotic ointment, couple Band-Aids, I will get some x-rays of his elbow, he can keep these covered with antibiotic ointment for the next week and return to see me as needed. Up-to-date on tetanus.  Right foot pain Resolved with a lateral wedge, he may cancel his follow-up appointment for this.  Viral gastroenteritis Improved considerably with time, he still has some left lower quadrant symptoms described in a way that sound to be tenesmus, its worse when he is under a great deal of stress. 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    ____________________________________________ Ihor Austin. Benjamin Stain, M.D., ABFM., CAQSM., AME. Primary Care and Sports Medicine [facility]  Adjunct Professor of Family Medicine  [employer] [facility] [employer]  Restaurant manager, fast food

## 2022-08-03 NOTE — Assessment & Plan Note (Signed)
Improved considerably with time, he still has some left lower quadrant symptoms described in a way that sound to be tenesmus, its worse when he is under a great deal of stress. 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

## 2022-08-03 NOTE — Assessment & Plan Note (Signed)
Resolved with a lateral wedge, he may cancel his follow-up appointment for this.

## 2022-08-03 NOTE — Assessment & Plan Note (Signed)
Pleasant 56 year old male, recently had a fall, he ended up with abrasions on his left elbow, left fifth finger, left knee. He does have some swelling posterior/lateral elbow but good motion and good strength, we will do an x-ray, abrasions on the knee and finger are superficial, we added some antibiotic ointment, couple Band-Aids, I will get some x-rays of his elbow, he can keep these covered with antibiotic ointment for the next week and return to see me as needed. Up-to-date on tetanus.

## 2022-08-07 ENCOUNTER — Ambulatory Visit: Payer: Managed Care, Other (non HMO) | Admitting: Sports Medicine

## 2022-08-10 NOTE — Progress Notes (Unsigned)
After 3 attempts to reach patient by phone, results mailed to home address.

## 2022-08-27 ENCOUNTER — Ambulatory Visit
Admission: EM | Admit: 2022-08-27 | Discharge: 2022-08-27 | Disposition: A | Discharge status: Home / Self Care | Payer: Managed Care, Other (non HMO)

## 2022-08-27 ENCOUNTER — Other Ambulatory Visit: Payer: Self-pay

## 2022-08-27 ENCOUNTER — Encounter: Payer: Self-pay | Admitting: Urgent Care

## 2022-08-27 DIAGNOSIS — M7061 Trochanteric bursitis, right hip: Secondary | ICD-10-CM

## 2022-08-27 MED ORDER — CELECOXIB 200 MG PO CAPS: 200.0000 mg | ORAL_CAPSULE | Freq: Two times a day (BID) | ORAL | 0 refills | Status: AC

## 2022-08-27 MED ORDER — KETOROLAC TROMETHAMINE 60 MG/2ML IM SOLN
60.0000 mg | Freq: Once | INTRAMUSCULAR | Status: AC
  Administered 2022-08-27: 60 mg via INTRAMUSCULAR

## 2022-08-27 NOTE — ED Provider Notes (Signed)
[name]    CSN: [id number] Arrival date & time: 08/27/22  0944      History   Chief Complaint Chief Complaint  Patient presents with   Hip Pain    HPI Benjamin Singleton is a 56 y.o. male.   56 year old male patient presents today due to right hip pain for the past 2 days.  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  Denies any injury or accident, but states on Tuesday morning he woke up to an 8 out of 10 pain to his right hip.  States it is around the greater trochanteric region, with no radiation down his leg, to his groin, or to his buttocks.  He feels like there is a lump there.  He has been trying to massage the area without any relief.  He did take over-the-counter Tylenol and also took an old prescription of baclofen with no relief.  Certain ranges of motion make it worse, standing provides the most relief.  He denies any change to bowel or bladder, no radicular symptoms.  There is no pain to his lumbar spine at this time.  States he is no longer taking meloxicam.  Has a Medrol Dosepak with him, but did not want to take it as he prefers "natural treatment options".   Hip Pain   Past Medical History:  Diagnosis Date   Gastritis 06/14/2015   GERD (gastroesophageal reflux disease) 06/14/2015   Hypertension 05/24/2019   Sleep apnea     Patient Active Problem List   Diagnosis Date Noted   Abrasion of skin 08/03/2022   Viral gastroenteritis 07/07/2022   Right foot pain 07/07/2022   Right knee pain 02/15/2022   Prediabetes 02/15/2022   Lumbar degenerative disc disease 09/02/2021   Hypertension 05/24/2019   Actinic keratosis 05/04/2019   Acute pain of left shoulder 03/23/2017   Constipation 12/10/2015   Gastritis 06/14/2015   GERD (gastroesophageal reflux disease) 06/14/2015   Obese 06/14/2015   Snores 06/14/2015    History reviewed. No pertinent surgical history.     Home  Medications    Prior to Admission medications   Medication Sig Start Date End Date Taking? Authorizing Provider  baclofen (LIORESAL) 10 MG tablet Take 10 mg by mouth 3 (three) times daily.   Yes [provider]  celecoxib (CELEBREX) 200 MG capsule Take 1 capsule (200 mg total) by mouth 2 (two) times daily for 14 days. 08/27/22 09/10/22 Yes Sawyer Kahan L, PA  Acetaminophen (TYLENOL 8 HOUR PO) Take by mouth.    [provider]  Homeopathic Products (ARNICARE ARNICA) CREA Apply topically.    [provider]  Multiple Vitamin (MULTIVITAMIN WITH MINERALS) TABS tablet Take 1 tablet by mouth daily.    [provider]    Family History Family History  Problem Relation Age of Onset   Hypertension Mother    Cancer Father        Prostate   Hypertension Other    Diabetes Other    Cancer Other    Cancer Maternal Aunt        ovarian    Social History Social History   Tobacco Use   Smoking status: Never   Smokeless tobacco: Never  Vaping Use   Vaping Use: Never used  Substance Use Topics   Alcohol use: Yes    Comment: social   Drug use: No     Allergies   Penicillins   Review  of Systems Review of Systems As per HPI  Physical Exam Triage Vital Signs ED Triage Vitals  Enc Vitals Group     BP 08/27/22 0956 (!) 143/83     Pulse Rate 08/27/22 0956 70     Resp 08/27/22 0956 16     Temp 08/27/22 0956 98.5 F (36.9 C)     Temp Source 08/27/22 0956 Oral     SpO2 08/27/22 0956 97 %     Weight --      Height --      Head Circumference --      Peak Flow --      Pain Score 08/27/22 0957 8     Pain Loc --      Pain Edu? --      Excl. in GC? --    No data found.  Updated Vital Signs BP (!) 143/83 (BP Location: Left Arm)   Pulse 70   Temp 98.5 F (36.9 C) (Oral)   Resp 16   Ht 5\' 10"  (1.778 m)   Wt 258 lb (117 kg)   SpO2 97%   BMI 37.02 kg/m   Visual Acuity Right Eye Distance:   Left Eye Distance:   Bilateral Distance:     Right Eye Near:   Left Eye Near:    Bilateral Near:     Physical Exam Vitals and nursing note reviewed.  Constitutional:      General: He is not in acute distress.    Appearance: Normal appearance. He is obese. He is not ill-appearing, toxic-appearing or diaphoretic.     Comments: Pt standing, holding onto bed with hand. Rubbing R greater trochanteric region  Cardiovascular:     Rate and Rhythm: Normal rate and regular rhythm.     Pulses: Normal pulses.  Pulmonary:     Effort: Pulmonary effort is normal.     Breath sounds: Normal breath sounds.  Musculoskeletal:        General: Tenderness (reproducible tenderness to palpation of R trochanteric bursa) present. No swelling or signs of injury.     Right hip: Tenderness (to R greater trochanteric bursa) present. No deformity, lacerations or crepitus. Normal range of motion. Normal strength.     Left hip: Normal. No deformity, lacerations, tenderness, bony tenderness or crepitus. Normal range of motion. Normal strength.     Right upper leg: Normal. No swelling, edema, deformity, lacerations, tenderness or bony tenderness.     Left upper leg: Normal. No swelling, edema, deformity, lacerations, tenderness or bony tenderness.     Right lower leg: No edema.     Left lower leg: No edema.       Legs:     Comments: Pain with extension of leg, otherwise normal ROM Negative Ober test  Neurological:     Mental Status: He is alert.      UC Treatments / Results  Labs (all labs ordered are listed, but only abnormal results are displayed) Labs Reviewed - No data to display  EKG   Radiology No results found.  Procedures Procedures (including critical care time)  Medications Ordered in UC Medications  ketorolac (TORADOL) injection 60 mg (60 mg Intramuscular Given 08/27/22 1302)    Initial Impression / Assessment and Plan / UC Course  I have reviewed the triage vital signs and the nursing notes.  Pertinent labs & imaging results  that were available during my care of the patient were reviewed by me and considered in my medical decision making (see chart for  details).     R trochanteric bursitis -pain reproducible to palpation of the greater trochanter.  Patient does have risk factors for this including his job.  Recommended patient ice the region 2-3 times daily for the next several days.  Patient is no longer taking meloxicam, will switch to Celebrex twice daily.  Patient given a Toradol injection in the office.  If NSAIDs ineffective after 1 week, may take the Medrol Dosepak he already has at home.  Consider follow-up with sports medicine for possible injection should symptoms persist.  Handouts provided, recommended that patient use the rehab exercises attached to the discharge paperwork.    Final Clinical Impressions(s) / UC Diagnoses   Final diagnoses:  Trochanteric bursitis of right hip     Discharge Instructions      You appear to be suffering from right hip greater trochanteric bursitis.  This is inflammation of the bursa around the bone and muscles. You were given an injection called ketorolac in our office. You may take 1 capsule of Celebrex this evening, starting tomorrow you may take it twice daily. Please ice your hip 2-3 times daily. If you do not notice a significant improvement in your discomfort over the next 7 days, you may stop your Celebrex and take the Medrol Dosepak you have. Do not take celebrex and medrol at the same time. Please read the attached handouts, and perform the rehab exercises. Please call and schedule follow-up with sports medicine within 1 to 2 weeks to ensure complete resolution.     ED Prescriptions     Medication Sig Dispense Auth. Provider   celecoxib (CELEBREX) 200 MG capsule Take 1 capsule (200 mg total) by mouth 2 (two) times daily for 14 days. 28 capsule Dru Laurel L, PA      PDMP not reviewed this encounter.   Maretta Bees, [place] 08/27/22 1307

## 2022-08-27 NOTE — ED Triage Notes (Signed)
Rt hip pain x 2 days He fell and hurt his Left knee one month

## 2022-08-27 NOTE — Discharge Instructions (Addendum)
You appear to be suffering from right hip greater trochanteric bursitis.  This is inflammation of the bursa around the bone and muscles. You were given an injection called ketorolac in our office. You may take 1 capsule of Celebrex this evening, starting tomorrow you may take it twice daily. Please ice your hip 2-3 times daily. If you do not notice a significant improvement in your discomfort over the next 7 days, you may stop your Celebrex and take the Medrol Dosepak you have. Do not take celebrex and medrol at the same time. Please read the attached handouts, and perform the rehab exercises. Please call and schedule follow-up with sports medicine within 1 to 2 weeks to ensure complete resolution.

## 2022-10-06 ENCOUNTER — Other Ambulatory Visit (HOSPITAL_BASED_OUTPATIENT_CLINIC_OR_DEPARTMENT_OTHER): Payer: Self-pay

## 2022-10-06 MED ORDER — COVID-19 MRNA VAC-TRIS(PFIZER) 30 MCG/0.3ML IM SUSY
PREFILLED_SYRINGE | INTRAMUSCULAR | 0 refills | Status: DC
  Filled 2022-10-06: qty 0.3, 1d supply, fill #0

## 2022-10-06 MED ORDER — INFLUENZA VAC SPLIT QUAD 0.5 ML IM SUSY
PREFILLED_SYRINGE | INTRAMUSCULAR | 0 refills | Status: DC
  Filled 2022-10-06: qty 0.5, 1d supply, fill #0

## 2023-03-22 ENCOUNTER — Ambulatory Visit
Admission: EM | Admit: 2023-03-22 | Discharge: 2023-03-22 | Disposition: A | Discharge status: Home / Self Care | Payer: Managed Care, Other (non HMO) | Attending: Family Medicine | Admitting: Family Medicine

## 2023-03-22 DIAGNOSIS — R42 Dizziness and giddiness: Secondary | ICD-10-CM

## 2023-03-22 MED ORDER — MECLIZINE HCL 25 MG PO TABS: 25.0000 mg | ORAL_TABLET | Freq: Three times a day (TID) | ORAL | 0 refills | Status: DC | PRN

## 2023-03-22 NOTE — Discharge Instructions (Addendum)
Advised patient to take medication as directed.  Encouraged increase daily water intake to 64 ounces per day while taking this medication. Advised if symptoms worsen and/or unresolved follow-up with PCP or ENT for further evaluation.

## 2023-03-22 NOTE — ED Provider Notes (Signed)
[facility]    CSN: [id number] Arrival date & time: 03/22/23  1629      History   Chief Complaint Chief Complaint  Patient presents with   Nasal Congestion   Dizziness   Otalgia    RT    HPI Benjamin Singleton is a 57 y.o. male.   HPI 57 year old male presents with nasal congestion, right ear pain and intermittent dizziness for 3 weeks. PMH significant for obesity, HTN, and sleep apnea   Past Medical History:  Diagnosis Date   Gastritis 06/14/2015   GERD (gastroesophageal reflux disease) 06/14/2015   Hypertension 05/24/2019   Sleep apnea     Patient Active Problem List   Diagnosis Date Noted   Abrasion of skin 08/03/2022   Viral gastroenteritis 07/07/2022   Right foot pain 07/07/2022   Right knee pain 02/15/2022   Prediabetes 02/15/2022   Lumbar degenerative disc disease 09/02/2021   Hypertension 05/24/2019   Actinic keratosis 05/04/2019   Acute pain of left shoulder 03/23/2017   Constipation 12/10/2015   Gastritis 06/14/2015   GERD (gastroesophageal reflux disease) 06/14/2015   Obese 06/14/2015   Snores 06/14/2015    History reviewed. No pertinent surgical history.     Home Medications    Prior to Admission medications   Medication Sig Start Date End Date Taking? Authorizing Provider  meclizine (ANTIVERT) 25 MG tablet Take 1 tablet (25 mg total) by mouth 3 (three) times daily as needed for dizziness. 03/22/23  Yes Trevor Iha, FNP  Acetaminophen (TYLENOL 8 HOUR PO) Take by mouth.    [provider]  baclofen (LIORESAL) 10 MG tablet Take 10 mg by mouth 3 (three) times daily.    [provider]  COVID-19 mRNA vaccine [phone] (COMIRNATY) syringe Inject into the muscle. 10/06/22   Judyann Munson, MD  Homeopathic Products (ARNICARE ARNICA) CREA Apply topically.    [provider]  influenza vac split quadrivalent PF (FLUARIX) 0.5 ML injection Inject into the muscle. 10/06/22   Judyann Munson, MD  Multiple  Vitamin (MULTIVITAMIN WITH MINERALS) TABS tablet Take 1 tablet by mouth daily.    [provider]    Family History Family History  Problem Relation Age of Onset   Hypertension Mother    Cancer Father        Prostate   Hypertension Other    Diabetes Other    Cancer Other    Cancer Maternal Aunt        ovarian    Social History Social History   Tobacco Use   Smoking status: Never   Smokeless tobacco: Never  Vaping Use   Vaping Use: Never used  Substance Use Topics   Alcohol use: Yes    Comment: social   Drug use: No     Allergies   Penicillins   Review of Systems Review of Systems   Physical Exam Triage Vital Signs ED Triage Vitals  Enc Vitals Group     BP 03/22/23 1641 (!) 152/91     Pulse Rate 03/22/23 1641 94     Resp 03/22/23 1641 17     Temp 03/22/23 1641 98.2 F (36.8 C)     Temp Source 03/22/23 1641 Oral     SpO2 03/22/23 1641 96 %     Weight --      Height --      Head Circumference --      Peak Flow --      Pain Score 03/22/23 1642 3  Pain Loc --      Pain Edu? --      Excl. in GC? --    No data found.  Updated Vital Signs BP (!) 152/91 (BP Location: Right Arm)   Pulse 94   Temp 98.2 F (36.8 C) (Oral)   Resp 17   SpO2 96%      Physical Exam Vitals and nursing note reviewed.  Constitutional:      Appearance: Normal appearance. He is normal weight.  HENT:     Head: Normocephalic and atraumatic.     Right Ear: Tympanic membrane, ear canal and external ear normal.     Left Ear: Tympanic membrane, ear canal and external ear normal.     Nose: Nose normal.     Mouth/Throat:     Mouth: Mucous membranes are moist.     Pharynx: Oropharynx is clear.  Eyes:     Extraocular Movements: Extraocular movements intact.     Conjunctiva/sclera: Conjunctivae normal.     Pupils: Pupils are equal, round, and reactive to light.  Cardiovascular:     Rate and Rhythm: Normal rate and regular rhythm.     Pulses: Normal pulses.      Heart sounds: Normal heart sounds.  Pulmonary:     Effort: Pulmonary effort is normal.     Breath sounds: Normal breath sounds. No wheezing, rhonchi or rales.  Musculoskeletal:        General: Normal range of motion.     Cervical back: Normal range of motion and neck supple.  Skin:    General: Skin is warm and dry.  Neurological:     General: No focal deficit present.     Mental Status: He is alert and oriented to person, place, and time. Mental status is at baseline.  Psychiatric:        Mood and Affect: Mood normal.        Behavior: Behavior normal.        Thought Content: Thought content normal.      UC Treatments / Results  Labs (all labs ordered are listed, but only abnormal results are displayed) Labs Reviewed - No data to display  EKG   Radiology No results found.  Procedures Procedures (including critical care time)  Medications Ordered in UC Medications - No data to display  Initial Impression / Assessment and Plan / UC Course  I have reviewed the triage vital signs and the nursing notes.  Pertinent labs & imaging results that were available during my care of the patient were reviewed by me and considered in my medical decision making (see chart for details).     MDM: 1.  Vertigo-Rx'd Meclizine 25 mg 3 times daily, as needed for dizziness/vertigo. Advised patient to take medication as directed.  Encouraged increase daily water intake to 64 ounces per day while taking this medication. Advised if symptoms worsen and/or unresolved follow-up with PCP or ENT for further evaluation. Final Clinical Impressions(s) / UC Diagnoses   Final diagnoses:  Vertigo     Discharge Instructions      Advised patient to take medication as directed.  Encouraged increase daily water intake to 64 ounces per day while taking this medication. Advised if symptoms worsen and/or unresolved follow-up with PCP or ENT for further evaluation.     ED Prescriptions     Medication  Sig Dispense Auth. Provider   meclizine (ANTIVERT) 25 MG tablet Take 1 tablet (25 mg total) by mouth 3 (three) times daily as needed  for dizziness. 30 tablet Trevor Iha, FNP      PDMP not reviewed this encounter.   Trevor Iha, FNP 03/22/23 1740

## 2023-03-22 NOTE — ED Triage Notes (Addendum)
Pt c/o congestion x 2 days. Also c/o intermittent RT ear pain and dizziness x 3 weeks. Has f/u with PCP next week (May 8th)

## 2023-03-31 ENCOUNTER — Ambulatory Visit: Payer: Managed Care, Other (non HMO) | Admitting: Family Medicine

## 2023-03-31 VITALS — BP 126/79 | HR 71 | Ht 70.0 in | Wt 274.0 lb

## 2023-03-31 DIAGNOSIS — R1032 Left lower quadrant pain: Secondary | ICD-10-CM

## 2023-03-31 DIAGNOSIS — I10 Essential (primary) hypertension: Secondary | ICD-10-CM | POA: Diagnosis not present

## 2023-03-31 DIAGNOSIS — R42 Dizziness and giddiness: Secondary | ICD-10-CM | POA: Diagnosis not present

## 2023-03-31 DIAGNOSIS — Z125 Encounter for screening for malignant neoplasm of prostate: Secondary | ICD-10-CM

## 2023-03-31 DIAGNOSIS — R7303 Prediabetes: Secondary | ICD-10-CM | POA: Diagnosis not present

## 2023-03-31 NOTE — Assessment & Plan Note (Signed)
Update A1c ?

## 2023-03-31 NOTE — Assessment & Plan Note (Signed)
Well-controlled at this time without medication.  Low-sodium diet encouraged.

## 2023-03-31 NOTE — Assessment & Plan Note (Signed)
Only very mildly tender.  No changes to bowels.  No red flags including blood in stool.  No palpable mass or hernia.  He will let me know if worsening.

## 2023-03-31 NOTE — Assessment & Plan Note (Signed)
Dizziness likely related to eustachian tube dysfunction.  This is resolved at this point.

## 2023-03-31 NOTE — Progress Notes (Signed)
Benjamin Singleton - 57 y.o. male MRN [id number]  Date of birth: Nov 23, 1966  Subjective Chief Complaint  Patient presents with   Dizziness   Weight Loss   Urinary Frequency    HPI Benjamin Singleton is a 57 year old male here today for follow-up visit.  He reports having episodes of dizziness recently.  This occurred last week but has resolved at this point.  He did admit to having some nasal congestion during that time.  Denies headaches, presyncopal symptoms, nausea or vomiting.  He reports of increased fatigue and urinary frequency.  Reports father with history of prostate cancer.  He does have prediabetes and weight has increased slightly over the past several months.  He denies dysuria, hematuria or urgency.  He has a sore spot on the left lower abdomen.  No changes to bowels.  He has not noticed any bulge over this area.  ROS:  A comprehensive ROS was completed and negative except as noted per HPI  Allergies  Allergen Reactions   Penicillins Rash    Past Medical History:  Diagnosis Date   Gastritis 06/14/2015   GERD (gastroesophageal reflux disease) 06/14/2015   Hypertension 05/24/2019   Sleep apnea     History reviewed. No pertinent surgical history.  Social History   Socioeconomic History   Marital status: Married    Spouse name: Not on file   Number of children: Not on file   Years of education: Not on file   Highest education level: Bachelor's degree (e.g., BA, AB, BS)  Occupational History   Not on file  Tobacco Use   Smoking status: Never   Smokeless tobacco: Never  Vaping Use   Vaping Use: Never used  Substance and Sexual Activity   Alcohol use: Yes    Comment: social   Drug use: No   Sexual activity: Yes  Other Topics Concern   Not on file  Social History Narrative   Not on file   Social Determinants of Health   Financial Resource Strain: Low Risk  (03/31/2023)   Overall Financial Resource Strain (CARDIA)    Difficulty of Paying Living  Expenses: Not hard at all  Food Insecurity: No Food Insecurity (03/31/2023)   Hunger Vital Sign    Worried About Running Out of Food in the Last Year: Never true    Ran Out of Food in the Last Year: Never true  Transportation Needs: No Transportation Needs (03/31/2023)   PRAPARE - Administrator, Civil Service (Medical): No    Lack of Transportation (Non-Medical): No  Physical Activity: Sufficiently Active (03/31/2023)   Exercise Vital Sign    Days of Exercise per Week: 4 days    Minutes of Exercise per Session: 60 min  Stress: Stress Concern Present (03/31/2023)   [employer] of Occupational Health - Occupational Stress Questionnaire    Feeling of Stress : Rather much  Social Connections: Moderately Integrated (03/31/2023)   Social Connection and Isolation Panel [NHANES]    Frequency of Communication with Friends and Family: Twice a week    Frequency of Social Gatherings with Friends and Family: Once a week    Attends Religious Services: Never    Database administrator or Organizations: Yes    Attends Engineer, structural: More than 4 times per year    Marital Status: Married    Family History  Problem Relation Age of Onset   Hypertension Mother    Cancer Father  Prostate   Hypertension Other    Diabetes Other    Cancer Other    Cancer Maternal Aunt        ovarian    Health Maintenance  Topic Date Due   HIV Screening  Never done   Hepatitis C Screening  Never done   Zoster Vaccines- Shingrix (1 of 2) Never done   COLONOSCOPY (Pts 45-44yrs Insurance coverage will need to be confirmed)  Never done   COVID-19 Vaccine (7 - 2023-24 season) 10/01/2023 (Originally 12/01/2022)   INFLUENZA VACCINE  06/24/2023   DTaP/Tdap/Td (2 - Td or Tdap) 10/31/2028   HPV VACCINES  Aged Out      ----------------------------------------------------------------------------------------------------------------------------------------------------------------------------------------------------------------- Physical Exam BP 126/79 (BP Location: Left Arm, Patient Position: Sitting, Cuff Size: Large)   Pulse 71   Ht 5\' 10"  (1.778 m)   Wt 274 lb (124.3 kg)   SpO2 95%   BMI 39.31 kg/m   Physical Exam Constitutional:      Appearance: Normal appearance.  HENT:     Head: Normocephalic and atraumatic.  Eyes:     General: No scleral icterus. Cardiovascular:     Rate and Rhythm: Normal rate and regular rhythm.  Pulmonary:     Effort: Pulmonary effort is normal.     Breath sounds: Normal breath sounds.  Abdominal:     General: Bowel sounds are normal. There is no distension.     Tenderness: There is no abdominal tenderness.     Hernia: No hernia is present.  Musculoskeletal:     Cervical back: Neck supple.  Neurological:     Mental Status: He is alert.  Psychiatric:        Mood and Affect: Mood normal.        Behavior: Behavior normal.     ------------------------------------------------------------------------------------------------------------------------------------------------------------------------------------------------------------------- Assessment and Plan  Hypertension Well-controlled at this time without medication.  Low-sodium diet encouraged.  Prediabetes Update A1c.  Dizziness and giddiness Dizziness likely related to eustachian tube dysfunction.  This is resolved at this point.  Left lower quadrant abdominal pain Only very mildly tender.  No changes to bowels.  No red flags including blood in stool.  No palpable mass or hernia.  He will let me know if worsening.   No orders of the defined types were placed in this encounter.   No follow-ups on file.    This visit occurred during the SARS-CoV-2 public health emergency.  Safety protocols were  in place, including screening questions prior to the visit, additional usage of staff PPE, and extensive cleaning of exam room while observing appropriate contact time as indicated for disinfecting solutions.

## 2023-04-01 LAB — CBC WITH DIFFERENTIAL/PLATELET
Absolute Monocytes: 858 cells/uL (ref 200–950)
Basophils Absolute: 39 cells/uL (ref 0–200)
Basophils Relative: 0.5 %
Eosinophils Absolute: 164 cells/uL (ref 15–500)
Eosinophils Relative: 2.1 %
HCT: 46.1 % (ref 38.5–50.0)
Hemoglobin: 15.5 g/dL (ref 13.2–17.1)
Lymphs Abs: 1903 cells/uL (ref 850–3900)
MCH: 29.5 pg (ref 27.0–33.0)
MCHC: 33.6 g/dL (ref 32.0–36.0)
MCV: 87.6 fL (ref 80.0–100.0)
MPV: 10.7 fL (ref 7.5–12.5)
Monocytes Relative: 11 %
Neutro Abs: 4836 cells/uL (ref 1500–7800)
Neutrophils Relative %: 62 %
Platelets: 282 10*3/uL (ref 140–400)
RBC: 5.26 10*6/uL (ref 4.20–5.80)
RDW: 12.9 % (ref 11.0–15.0)
Total Lymphocyte: 24.4 %
WBC: 7.8 10*3/uL (ref 3.8–10.8)

## 2023-04-01 LAB — COMPLETE METABOLIC PANEL WITH GFR
AG Ratio: 1.7 (calc) (ref 1.0–2.5)
ALT: 23 U/L (ref 9–46)
AST: 19 U/L (ref 10–35)
Albumin: 4.5 g/dL (ref 3.6–5.1)
Alkaline phosphatase (APISO): 65 U/L (ref 35–144)
BUN: 19 mg/dL (ref 7–25)
CO2: 27 mmol/L (ref 20–32)
Calcium: 9.5 mg/dL (ref 8.6–10.3)
Chloride: 101 mmol/L (ref 98–110)
Creat: 0.85 mg/dL (ref 0.70–1.30)
Globulin: 2.7 g/dL (calc) (ref 1.9–3.7)
Glucose, Bld: 87 mg/dL (ref 65–99)
Potassium: 4.3 mmol/L (ref 3.5–5.3)
Sodium: 139 mmol/L (ref 135–146)
Total Bilirubin: 0.5 mg/dL (ref 0.2–1.2)
Total Protein: 7.2 g/dL (ref 6.1–8.1)
eGFR: 101 mL/min/{1.73_m2} (ref >=60)

## 2023-04-01 LAB — HEMOGLOBIN A1C
Hgb A1c MFr Bld: 6.2 % of total Hgb — ABNORMAL HIGH (ref <5.7)
Mean Plasma Glucose: 131 mg/dL
eAG (mmol/L): 7.3 mmol/L

## 2023-04-01 LAB — PSA: PSA: 1.76 ng/mL (ref <=4.00)

## 2023-04-06 ENCOUNTER — Ambulatory Visit
Admission: EM | Admit: 2023-04-06 | Discharge: 2023-04-06 | Disposition: A | Discharge status: Home / Self Care | Payer: Managed Care, Other (non HMO) | Attending: Family Medicine | Admitting: Family Medicine

## 2023-04-06 ENCOUNTER — Encounter: Payer: Self-pay | Admitting: Emergency Medicine

## 2023-04-06 DIAGNOSIS — R0981 Nasal congestion: Secondary | ICD-10-CM | POA: Diagnosis not present

## 2023-04-06 DIAGNOSIS — J309 Allergic rhinitis, unspecified: Secondary | ICD-10-CM | POA: Diagnosis not present

## 2023-04-06 DIAGNOSIS — J0100 Acute maxillary sinusitis, unspecified: Secondary | ICD-10-CM | POA: Diagnosis not present

## 2023-04-06 DIAGNOSIS — R059 Cough, unspecified: Secondary | ICD-10-CM

## 2023-04-06 LAB — POC SARS CORONAVIRUS 2 AG -  ED: SARS Coronavirus 2 Ag: NEGATIVE

## 2023-04-06 LAB — POCT RAPID STREP A (OFFICE): Rapid Strep A Screen: NEGATIVE

## 2023-04-06 LAB — POCT INFLUENZA A/B
Influenza A, POC: NEGATIVE
Influenza B, POC: NEGATIVE

## 2023-04-06 MED ORDER — DOXYCYCLINE HYCLATE 100 MG PO CAPS: 100.0000 mg | ORAL_CAPSULE | Freq: Two times a day (BID) | ORAL | 0 refills | Status: AC

## 2023-04-06 MED ORDER — BENZONATATE 200 MG PO CAPS: 200.0000 mg | ORAL_CAPSULE | Freq: Three times a day (TID) | ORAL | 0 refills | Status: AC | PRN

## 2023-04-06 MED ORDER — PREDNISONE 20 MG PO TABS: ORAL_TABLET | ORAL | 0 refills | Status: DC

## 2023-04-06 MED ORDER — FEXOFENADINE HCL 180 MG PO TABS: 180.0000 mg | ORAL_TABLET | Freq: Every day | ORAL | 0 refills | Status: DC

## 2023-04-06 NOTE — Discharge Instructions (Addendum)
Instructed patient to take medication as directed with food to completion.  Advised patient to take Prednisone and Allegra with first dose of Doxycycline for the next 5 of 10 days.  Advised may use Allegra as needed afterwards for concurrent postnasal drainage/drip.  Advised may use Tessalon daily or as needed for cough.  Encouraged increase daily water intake while taking these medications.  Advised if symptoms worsen and/or unresolved please follow-up with PCP or here for further evaluation.

## 2023-04-06 NOTE — ED Provider Notes (Signed)
[facility]    CSN: [id number] Arrival date & time: 04/06/23  0914      History   Chief Complaint Chief Complaint  Patient presents with   Cough    HPI Benjamin Singleton Benjamin Singleton is a 57 y.o. male.   HPI  Past Medical History:  Diagnosis Date   Gastritis 06/14/2015   GERD (gastroesophageal reflux disease) 06/14/2015   Hypertension 05/24/2019   Sleep apnea     Patient Active Problem List   Diagnosis Date Noted   Left lower quadrant abdominal pain 03/31/2023   Right foot pain 07/07/2022   Right knee pain 02/15/2022   Prediabetes 02/15/2022   Lumbar degenerative disc disease 09/02/2021   Hypertension 05/24/2019   Actinic keratosis 05/04/2019   Snores 06/14/2015   Dizziness and giddiness 07/24/2014    History reviewed. No pertinent surgical history.     Home Medications    Prior to Admission medications   Medication Sig Start Date End Date Taking? Authorizing Provider  benzonatate (TESSALON) 200 MG capsule Take 1 capsule (200 mg total) by mouth 3 (three) times daily as needed for up to 7 days. 04/06/23 04/13/23 Yes Trevor Iha, FNP  doxycycline (VIBRAMYCIN) 100 MG capsule Take 1 capsule (100 mg total) by mouth 2 (two) times daily for 10 days. 04/06/23 04/16/23 Yes Trevor Iha, FNP  fexofenadine [facility] ALLERGY) 180 MG tablet Take 1 tablet (180 mg total) by mouth daily for 15 days. 04/06/23 04/21/23 Yes Trevor Iha, FNP  predniSONE (DELTASONE) 20 MG tablet Take 3 tabs PO daily x 5 days. 04/06/23  Yes Trevor Iha, FNP  Homeopathic Products (ARNICARE ARNICA) CREA Apply topically.    [provider]  Multiple Vitamin (MULTIVITAMIN WITH MINERALS) TABS tablet Take 1 tablet by mouth daily.    [provider]    Family History Family History  Problem Relation Age of Onset   Hypertension Mother    Cancer Father        Prostate   Cancer Maternal Aunt        ovarian   Hypertension Other    Diabetes Other    Cancer Other     Social  History Social History   Tobacco Use   Smoking status: Never   Smokeless tobacco: Never  Vaping Use   Vaping Use: Never used  Substance Use Topics   Alcohol use: Yes    Comment: social   Drug use: No     Allergies   Penicillins   Review of Systems Review of Systems   Physical Exam Triage Vital Signs ED Triage Vitals  Enc Vitals Group     BP 04/06/23 0929 134/86     Pulse Rate 04/06/23 0929 80     Resp 04/06/23 0929 18     Temp 04/06/23 0929 98.4 F (36.9 C)     Temp Source 04/06/23 0929 Oral     SpO2 04/06/23 0929 96 %     Weight 04/06/23 0932 274 lb (124.3 kg)     Height 04/06/23 0932 5\' 10"  (1.778 m)     Head Circumference --      Peak Flow --      Pain Score 04/06/23 0931 3     Pain Loc --      Pain Edu? --      Excl. in GC? --    No data found.  Updated Vital Signs BP 134/86 (BP Location: Left Arm)   Pulse 80   Temp 98.4 F (36.9 C) (Oral)  Resp 18   Ht 5\' 10"  (1.778 m)   Wt 274 lb (124.3 kg)   SpO2 96%   BMI 39.31 kg/m     Physical Exam Vitals and nursing note reviewed.  Constitutional:      Appearance: Normal appearance. He is normal weight.  HENT:     Head: Normocephalic and atraumatic.     Right Ear: Tympanic membrane and external ear normal.     Left Ear: Tympanic membrane and external ear normal.     Ears:     Comments: Significant eustachian tube dysfunction noted    Nose:     Comments: Turbinates are erythematous/edematous with scant mucopurulent discharge/drainage noted    Mouth/Throat:     Mouth: Mucous membranes are moist.     Pharynx: Oropharynx is clear.     Comments: Significant amount of clear drainage of posterior oropharynx noted Eyes:     Extraocular Movements: Extraocular movements intact.     Conjunctiva/sclera: Conjunctivae normal.     Pupils: Pupils are equal, round, and reactive to light.  Cardiovascular:     Rate and Rhythm: Normal rate and regular rhythm.     Pulses: Normal pulses.     Heart sounds: Normal  heart sounds.  Pulmonary:     Effort: Pulmonary effort is normal.     Breath sounds: Normal breath sounds. No wheezing, rhonchi or rales.     Comments: Infrequent nonproductive cough noted on exam Musculoskeletal:        General: Normal range of motion.     Cervical back: Normal range of motion and neck supple. No tenderness.  Lymphadenopathy:     Cervical: No cervical adenopathy.  Skin:    General: Skin is warm and dry.  Neurological:     General: No focal deficit present.     Mental Status: He is alert and oriented to person, place, and time. Mental status is at baseline.  Psychiatric:        Mood and Affect: Mood normal.        Behavior: Behavior normal.        Thought Content: Thought content normal.      UC Treatments / Results  Labs (all labs ordered are listed, but only abnormal results are displayed) Labs Reviewed  POCT RAPID STREP A (OFFICE)  POC SARS CORONAVIRUS 2 AG -  ED  POCT INFLUENZA A/B    EKG   Radiology No results found.  Procedures Procedures (including critical care time)  Medications Ordered in UC Medications - No data to display  Initial Impression / Assessment and Plan / UC Course  I have reviewed the triage vital signs and the nursing notes.  Pertinent labs & imaging results that were available during my care of the patient were reviewed by me and considered in my medical decision making (see chart for details).     MDM: 1.  Acute maxillary sinusitis, recurrence not specified-Rx'd doxycycline 100 mg twice daily x 10 days; 2.  Congestion of nasal sinus-Rx'd Prednisone 60 mg daily x 5 days; 3.  Allergic rhinitis, unspecified seasonality, unspecified trigger-Rx'd Allegra 180 mg daily x 5 days, then as needed; 4.  Cough-Rx'd Tessalon 200 mg 3 times daily, as needed. Instructed patient to take medication as directed with food to completion.  Advised patient to take Prednisone and Allegra with first dose of Doxycycline for the next 5 of 10 days.   Advised may use Allegra as needed afterwards for concurrent postnasal drainage/drip.  Advised may use Tessalon  daily or as needed for cough.  Encouraged increase daily water intake while taking these medications.  Advised if symptoms worsen and/or unresolved please follow-up with PCP or here for further evaluation.  Patient discharged home, hemodynamically stable. Final Clinical Impressions(s) / UC Diagnoses   Final diagnoses:  Acute maxillary sinusitis, recurrence not specified  Congestion of nasal sinus  Allergic rhinitis, unspecified seasonality, unspecified trigger  Cough, unspecified type     Discharge Instructions      Instructed patient to take medication as directed with food to completion.  Advised patient to take Prednisone and Allegra with first dose of Doxycycline for the next 5 of 10 days.  Advised may use Allegra as needed afterwards for concurrent postnasal drainage/drip.  Advised may use Tessalon daily or as needed for cough.  Encouraged increase daily water intake while taking these medications.  Advised if symptoms worsen and/or unresolved please follow-up with PCP or here for further evaluation.     ED Prescriptions     Medication Sig Dispense Auth. Provider   doxycycline (VIBRAMYCIN) 100 MG capsule Take 1 capsule (100 mg total) by mouth 2 (two) times daily for 10 days. 20 capsule Trevor Iha, FNP   predniSONE (DELTASONE) 20 MG tablet Take 3 tabs PO daily x 5 days. 15 tablet Trevor Iha, FNP   fexofenadine [facility] ALLERGY) 180 MG tablet Take 1 tablet (180 mg total) by mouth daily for 15 days. 15 tablet Trevor Iha, FNP   benzonatate (TESSALON) 200 MG capsule Take 1 capsule (200 mg total) by mouth 3 (three) times daily as needed for up to 7 days. 40 capsule Trevor Iha, FNP      PDMP not reviewed this encounter.   Trevor Iha, FNP 04/06/23 1041

## 2023-04-06 NOTE — ED Triage Notes (Signed)
Cough started on Saturday night  Productive (yellow to brown) OTC saline spray on Sunday  OTC Claritin, vit C, echinacea, honey cough drops Sore throat

## 2023-06-15 ENCOUNTER — Ambulatory Visit (INDEPENDENT_AMBULATORY_CARE_PROVIDER_SITE_OTHER): Payer: Managed Care, Other (non HMO) | Admitting: Sports Medicine

## 2023-06-15 ENCOUNTER — Ambulatory Visit (INDEPENDENT_AMBULATORY_CARE_PROVIDER_SITE_OTHER): Payer: Managed Care, Other (non HMO)

## 2023-06-15 DIAGNOSIS — G8929 Other chronic pain: Secondary | ICD-10-CM

## 2023-06-15 DIAGNOSIS — M5136 Other intervertebral disc degeneration, lumbar region: Secondary | ICD-10-CM | POA: Diagnosis not present

## 2023-06-15 DIAGNOSIS — M51369 Other intervertebral disc degeneration, lumbar region without mention of lumbar back pain or lower extremity pain: Secondary | ICD-10-CM

## 2023-06-15 DIAGNOSIS — M25571 Pain in right ankle and joints of right foot: Secondary | ICD-10-CM

## 2023-06-15 MED ORDER — PREDNISONE 50 MG PO TABS: ORAL_TABLET | ORAL | 0 refills | Status: DC

## 2023-06-15 NOTE — Assessment & Plan Note (Signed)
This is a pleasant 58 year old male, he has had approximately 6 weeks of pain medial right ankle localized at the navicular. On exam he has tenderness at the navicular prominence, no tenderness over the tibialis posterior tendon itself. Suspect navicular stress injury, adding a cam boot for at least 4 weeks, tibialis posterior conditioning. X-rays. Return to see me in 4 to 6 weeks, MRI if not better.

## 2023-06-15 NOTE — Assessment & Plan Note (Signed)
Lumbar DDD, mild increased flare of his back pain, adding 5 days of prednisone, he is not hurting tremendously today so he will hold off on taking the prednisone, I would like updated x-rays and I would like him to restart his home physical therapy, return to see me in 4 to 6 weeks as needed for this.

## 2023-06-15 NOTE — Progress Notes (Signed)
    Procedures performed today:    None.  Independent interpretation of notes and tests performed by another provider:   None.  Brief History, Exam, Impression, and Recommendations:    Right ankle pain This is a pleasant 57 year old male, he has had approximately 6 weeks of pain medial right ankle localized at the navicular. On exam he has tenderness at the navicular prominence, no tenderness over the tibialis posterior tendon itself. Suspect navicular stress injury, adding a cam boot for at least 4 weeks, tibialis posterior conditioning. X-rays. Return to see me in 4 to 6 weeks, MRI if not better.  Lumbar degenerative disc disease Lumbar DDD, mild increased flare of his back pain, adding 5 days of prednisone, he is not hurting tremendously today so he will hold off on taking the prednisone, I would like updated x-rays and I would like him to restart his home physical therapy, return to see me in 4 to 6 weeks as needed for this.    ____________________________________________ Ihor Austin. Benjamin Stain, M.D., ABFM., CAQSM., AME. Primary Care and Sports Medicine [facility]  Adjunct Professor of Family Medicine  [place] [employer] [facility] [employer]  Restaurant manager, fast food

## 2023-06-28 ENCOUNTER — Encounter (INDEPENDENT_AMBULATORY_CARE_PROVIDER_SITE_OTHER): Payer: Managed Care, Other (non HMO) | Admitting: Sports Medicine

## 2023-06-28 DIAGNOSIS — G8929 Other chronic pain: Secondary | ICD-10-CM

## 2023-06-28 DIAGNOSIS — M25571 Pain in right ankle and joints of right foot: Secondary | ICD-10-CM | POA: Diagnosis not present

## 2023-06-29 NOTE — Telephone Encounter (Signed)
I spent 5 total minutes of online digital evaluation and management services in this patient-initiated request for online care. 

## 2023-07-20 ENCOUNTER — Encounter: Payer: Self-pay | Admitting: Sports Medicine

## 2023-07-20 ENCOUNTER — Ambulatory Visit (INDEPENDENT_AMBULATORY_CARE_PROVIDER_SITE_OTHER): Payer: Managed Care, Other (non HMO) | Admitting: Sports Medicine

## 2023-07-20 DIAGNOSIS — M25571 Pain in right ankle and joints of right foot: Secondary | ICD-10-CM

## 2023-07-20 DIAGNOSIS — M5136 Other intervertebral disc degeneration, lumbar region: Secondary | ICD-10-CM | POA: Diagnosis not present

## 2023-07-20 DIAGNOSIS — M51369 Other intervertebral disc degeneration, lumbar region without mention of lumbar back pain or lower extremity pain: Secondary | ICD-10-CM

## 2023-07-20 DIAGNOSIS — G8929 Other chronic pain: Secondary | ICD-10-CM | POA: Diagnosis not present

## 2023-07-20 NOTE — Assessment & Plan Note (Signed)
Known lumbar DDD, much better with prednisone, home physical therapy, return as needed.

## 2023-07-20 NOTE — Assessment & Plan Note (Signed)
10 weeks ago had increasing pain right medial foot localized directly over the navicular with tenderness at the navicular prominence but noted over the tibialis posterior tendon itself. We suspected navicular stress injury, added a cam boot, he wore this for 4 weeks and things improved, he switched into a regular shoe and had recurrence of pain. I did advise him previously that stress fractures can take approximately 3 months to heal, so we will do at least an additional month in the boot before considering an MRI for confirmation.

## 2023-07-20 NOTE — Progress Notes (Signed)
    Procedures performed today:    None.  Independent interpretation of notes and tests performed by another provider:   None.  Brief History, Exam, Impression, and Recommendations:    Lumbar degenerative disc disease Known lumbar DDD, much better with prednisone, home physical therapy, return as needed.  Right ankle pain 10 weeks ago had increasing pain right medial foot localized directly over the navicular with tenderness at the navicular prominence but noted over the tibialis posterior tendon itself. We suspected navicular stress injury, added a cam boot, he wore this for 4 weeks and things improved, he switched into a regular shoe and had recurrence of pain. I did advise him previously that stress fractures can take approximately 3 months to heal, so we will do at least an additional month in the boot before considering an MRI for confirmation.  I spent 30 minutes of total time managing this patient today, this includes chart review, face to face, and non-face to face time.  ____________________________________________ Benjamin Singleton. Benjamin Stain, M.D., ABFM., CAQSM., AME. Primary Care and Sports Medicine [place] [facility]  Adjunct Professor of Family Medicine  [place] [employer] [facility] [employer]  Restaurant manager, fast food

## 2023-07-29 ENCOUNTER — Ambulatory Visit: Payer: Managed Care, Other (non HMO) | Admitting: Family Medicine

## 2023-08-10 ENCOUNTER — Ambulatory Visit (INDEPENDENT_AMBULATORY_CARE_PROVIDER_SITE_OTHER): Payer: Managed Care, Other (non HMO) | Admitting: Family Medicine

## 2023-08-10 ENCOUNTER — Encounter: Payer: Self-pay | Admitting: Family Medicine

## 2023-08-10 DIAGNOSIS — R350 Frequency of micturition: Secondary | ICD-10-CM | POA: Insufficient documentation

## 2023-08-10 DIAGNOSIS — Z23 Encounter for immunization: Secondary | ICD-10-CM

## 2023-08-10 DIAGNOSIS — Z1211 Encounter for screening for malignant neoplasm of colon: Secondary | ICD-10-CM

## 2023-08-10 LAB — POCT URINALYSIS DIP (CLINITEK)
Bilirubin, UA: NEGATIVE
Blood, UA: NEGATIVE
Glucose, UA: NEGATIVE mg/dL
Ketones, POC UA: NEGATIVE mg/dL
Nitrite, UA: NEGATIVE
POC PROTEIN,UA: NEGATIVE
Spec Grav, UA: 1.02 (ref 1.010–1.025)
Urobilinogen, UA: 0.2 U/dL
pH, UA: 7 (ref 5.0–8.0)

## 2023-08-10 MED ORDER — TAMSULOSIN HCL 0.4 MG PO CAPS: 0.4000 mg | ORAL_CAPSULE | Freq: Every day | ORAL | 3 refills | Status: DC

## 2023-08-10 NOTE — Assessment & Plan Note (Signed)
We discussed limiting beverages before bedtime.  Urinalysis is unremarkable.  Recent PSA normal.  Adding trial of tamsulosin.  Discussed urology referral if not improving.

## 2023-08-10 NOTE — Patient Instructions (Signed)
Start tamsulosin daily.  Follow up in 2 months.

## 2023-08-10 NOTE — Assessment & Plan Note (Signed)
He is interested in medical weight management.  Would like referral to healthy weight and wellness.  Orders entered.

## 2023-08-10 NOTE — Progress Notes (Signed)
Benjamin Singleton - 57 y.o. male MRN [id number]  Date of birth: 07-08-1966  Subjective Chief Complaint  Patient presents with   Urinary Frequency    HPI Benjamin Singleton Shawnie Dapper is a 57 year old male here today for complaint of continued urinary frequency.  PSA in May was checked and was normal at that time.  Urinary frequency is typically worse at night.  Feels like he does not empty completely.  He denies any pain with urination, flank or back pain.  He does not have any trouble starting his stream.  He is requesting referral to medical weight management.  He would like referral for colon cancer screening.  He would like to have COVID and flu vaccine today.  ROS:  A comprehensive ROS was completed and negative except as noted per HPI  Allergies  Allergen Reactions   Penicillins Rash    Past Medical History:  Diagnosis Date   Gastritis 06/14/2015   GERD (gastroesophageal reflux disease) 06/14/2015   Hypertension 05/24/2019   Sleep apnea     History reviewed. No pertinent surgical history.  Social History   Socioeconomic History   Marital status: Married    Spouse name: Not on file   Number of children: Not on file   Years of education: Not on file   Highest education level: Bachelor's degree (e.g., BA, AB, BS)  Occupational History   Not on file  Tobacco Use   Smoking status: Never   Smokeless tobacco: Never  Vaping Use   Vaping status: Never Used  Substance and Sexual Activity   Alcohol use: Yes    Comment: social   Drug use: No   Sexual activity: Yes  Other Topics Concern   Not on file  Social History Narrative   Not on file   Social Determinants of Health   Financial Resource Strain: Low Risk  (03/31/2023)   Overall Financial Resource Strain (CARDIA)    Difficulty of Paying Living Expenses: Not hard at all  Food Insecurity: No Food Insecurity (03/31/2023)   Hunger Vital Sign    Worried About Running Out of Food in the Last Year: Never true    Ran Out of  Food in the Last Year: Never true  Transportation Needs: No Transportation Needs (03/31/2023)   PRAPARE - Administrator, Civil Service (Medical): No    Lack of Transportation (Non-Medical): No  Physical Activity: Sufficiently Active (03/31/2023)   Exercise Vital Sign    Days of Exercise per Week: 4 days    Minutes of Exercise per Session: 60 min  Stress: Stress Concern Present (03/31/2023)   [employer] of Occupational Health - Occupational Stress Questionnaire    Feeling of Stress : Rather much  Social Connections: Moderately Integrated (03/31/2023)   Social Connection and Isolation Panel [NHANES]    Frequency of Communication with Friends and Family: Twice a week    Frequency of Social Gatherings with Friends and Family: Once a week    Attends Religious Services: Never    Database administrator or Organizations: Yes    Attends Engineer, structural: More than 4 times per year    Marital Status: Married    Family History  Problem Relation Age of Onset   Hypertension Mother    Cancer Father        Prostate   Cancer Maternal Aunt        ovarian   Hypertension Other    Diabetes Other    Cancer Other  Health Maintenance  Topic Date Due   Zoster Vaccines- Shingrix (1 of 2) 11/09/2023 (Originally 11/14/1983)   Colonoscopy  08/09/2024 (Originally 11/13/2009)   Hepatitis C Screening  08/09/2024 (Originally 11/13/1982)   HIV Screening  08/09/2024 (Originally 11/14/1979)   COVID-19 Vaccine (8 - 2023-24 season) 10/05/2023   DTaP/Tdap/Td (2 - Td or Tdap) 10/31/2028   INFLUENZA VACCINE  Completed   HPV VACCINES  Aged Out     ----------------------------------------------------------------------------------------------------------------------------------------------------------------------------------------------------------------- Physical Exam BP 135/73   Pulse 68   Ht 5\' 10"  (1.778 m)   Wt 274 lb (124.3 kg)   SpO2 96%   BMI 39.31 kg/m   Physical  Exam Constitutional:      Appearance: Normal appearance.  Eyes:     General: No scleral icterus. Cardiovascular:     Rate and Rhythm: Normal rate and regular rhythm.  Pulmonary:     Effort: Pulmonary effort is normal.     Breath sounds: Normal breath sounds.  Musculoskeletal:     Cervical back: Neck supple.  Neurological:     Mental Status: He is alert.  Psychiatric:        Mood and Affect: Mood normal.        Behavior: Behavior normal.     ------------------------------------------------------------------------------------------------------------------------------------------------------------------------------------------------------------------- Assessment and Plan  Urinary frequency We discussed limiting beverages before bedtime.  Urinalysis is unremarkable.  Recent PSA normal.  Adding trial of tamsulosin.  Discussed urology referral if not improving.  Morbid obesity (HCC) He is interested in medical weight management.  Would like referral to healthy weight and wellness.  Orders entered.   Meds ordered this encounter  Medications   tamsulosin (FLOMAX) 0.4 MG CAPS capsule    Sig: Take 1 capsule (0.4 mg total) by mouth daily.    Dispense:  30 capsule    Refill:  3    Return in about 2 months (around 10/10/2023) for Urinary frequency.    This visit occurred during the SARS-CoV-2 public health emergency.  Safety protocols were in place, including screening questions prior to the visit, additional usage of staff PPE, and extensive cleaning of exam room while observing appropriate contact time as indicated for disinfecting solutions.

## 2023-08-12 LAB — URINE CULTURE: Organism ID, Bacteria: NO GROWTH

## 2023-08-24 ENCOUNTER — Ambulatory Visit (INDEPENDENT_AMBULATORY_CARE_PROVIDER_SITE_OTHER): Payer: Managed Care, Other (non HMO) | Admitting: Sports Medicine

## 2023-08-24 ENCOUNTER — Encounter: Payer: Self-pay | Admitting: Sports Medicine

## 2023-08-24 DIAGNOSIS — G8929 Other chronic pain: Secondary | ICD-10-CM

## 2023-08-24 DIAGNOSIS — M84374A Stress fracture, right foot, initial encounter for fracture: Secondary | ICD-10-CM | POA: Diagnosis not present

## 2023-08-24 DIAGNOSIS — M25571 Pain in right ankle and joints of right foot: Secondary | ICD-10-CM

## 2023-08-24 NOTE — Progress Notes (Signed)
Very pleasant 48   Procedures performed today:    None.  Independent interpretation of notes and tests performed by another provider:   None.  Brief History, Exam, Impression, and Recommendations:    Stress fracture of navicular bone of right foot 57 year old male, chronic right ankle pain localized directly over the navicular with tenderness at the navicular prominence. No tenderness over the tibialis posterior tendon itself, we suspected navicular stress fracture, this was initially treated in earlier this year. He wore the boot for 4 weeks, stopped it and then the pain came back, back in the end of August approximately 6 weeks ago we restarted the boot, things have improved considerably but when he comes out of the boot he still has discomfort, I have advised him to continue in the boot until I discontinue it. We will going get an MRI as well of his foot to evaluate/confirm the diagnosis of a navicular stress injury. If insufficient improvement over the next 6 weeks we will consider full cast placement.  I spent 30 minutes of total time managing this patient today, this includes chart review, face to face, and non-face to face time.  ____________________________________________ Benjamin Singleton. Benjamin Singleton, M.D., ABFM., CAQSM., AME. Primary Care and Sports Medicine [place] [facility]  Adjunct Professor of Family Medicine  [place] [employer] [facility] [employer]  Restaurant manager, fast food

## 2023-08-24 NOTE — Assessment & Plan Note (Signed)
57 year old male, chronic right ankle pain localized directly over the navicular with tenderness at the navicular prominence. No tenderness over the tibialis posterior tendon itself, we suspected navicular stress fracture, this was initially treated in earlier this year. He wore the boot for 4 weeks, stopped it and then the pain came back, back in the end of August approximately 6 weeks ago we restarted the boot, things have improved considerably but when he comes out of the boot he still has discomfort, I have advised him to continue in the boot until I discontinue it. We will going get an MRI as well of his foot to evaluate/confirm the diagnosis of a navicular stress injury. If insufficient improvement over the next 6 weeks we will consider full cast placement.

## 2023-09-02 ENCOUNTER — Encounter: Payer: Self-pay | Admitting: Gastroenterology

## 2023-09-03 ENCOUNTER — Ambulatory Visit: Payer: Managed Care, Other (non HMO) | Admitting: Podiatry

## 2023-09-07 ENCOUNTER — Ambulatory Visit: Payer: Managed Care, Other (non HMO) | Admitting: Podiatry

## 2023-09-14 ENCOUNTER — Ambulatory Visit: Payer: Managed Care, Other (non HMO) | Admitting: Podiatry

## 2023-09-17 ENCOUNTER — Encounter: Payer: Self-pay | Admitting: Podiatry

## 2023-09-17 ENCOUNTER — Ambulatory Visit (INDEPENDENT_AMBULATORY_CARE_PROVIDER_SITE_OTHER): Payer: Managed Care, Other (non HMO) | Admitting: Podiatry

## 2023-09-17 DIAGNOSIS — M76821 Posterior tibial tendinitis, right leg: Secondary | ICD-10-CM

## 2023-09-17 NOTE — Progress Notes (Signed)
  Subjective:  Patient ID: Benjamin Singleton, male    DOB: [birth year],  MRN: [id number]  Chief Complaint  Patient presents with   Foot Injury    RM# 6 Patient here for second opinion injured foot in July still in pain needing MRI for ongoing foot pain.    57 y.o. male presents with the above complaint.  Patient presents with complaint of right medial foot pain that has been going for quite some time.  Patient wanted to get a second opinion.  He injured his foot in July and still in pain.  Wanted to discuss treatment options pain scale 7 out of 10 dull achy in nature no other acute complaints.  Hurts with ambulator patient heart with pressure.   Review of Systems: Negative except as noted in the HPI. Denies N/V/F/Ch.  Past Medical History:  Diagnosis Date   Gastritis 06/14/2015   GERD (gastroesophageal reflux disease) 06/14/2015   Hypertension 05/24/2019   Sleep apnea     Current Outpatient Medications:    Homeopathic Products (ARNICARE ARNICA) CREA, Apply topically., Disp: , Rfl:    Multiple Vitamin (MULTIVITAMIN WITH MINERALS) TABS tablet, Take 1 tablet by mouth daily., Disp: , Rfl:    tamsulosin (FLOMAX) 0.4 MG CAPS capsule, Take 1 capsule (0.4 mg total) by mouth daily., Disp: 30 capsule, Rfl: 3   fexofenadine (ALLEGRA ALLERGY) 180 MG tablet, Take 1 tablet (180 mg total) by mouth daily for 15 days., Disp: 15 tablet, Rfl: 0  Social History   Tobacco Use  Smoking Status Never  Smokeless Tobacco Never    Allergies  Allergen Reactions   Penicillins Rash   Objective:  There were no vitals filed for this visit. There is no height or weight on file to calculate BMI. Constitutional Well developed. Well nourished.  Vascular Dorsalis pedis pulses palpable bilaterally. Posterior tibial pulses palpable bilaterally. Capillary refill normal to all digits.  No cyanosis or clubbing noted. Pedal hair growth normal.  Neurologic Normal speech. Oriented to person, place, and  time. Epicritic sensation to light touch grossly present bilaterally.  Dermatologic Nails well groomed and normal in appearance. No open wounds. No skin lesions.  Orthopedic: Pain along the course of the posterior tibial tendon pain at the insertion.  Pain with resisted plantar action inversion of the foot no pain with dorsiflexion eversion of the foot no pain at the peroneal tendon Achilles tendon ATFL ligament   Radiographs: None Assessment:   1. Posterior tibial tendinitis, right    Plan:  Patient was evaluated and treated and all questions answered.  Right posterior tibial tendinitis -All questions and concerns were discussed with the patient extensive detail.  Patient has failed continued conservative chemotherapy therefore I believe he will benefit from a steroid injection help decrease the confirmatory component surgical pain.  I discussed the risk for rupture associate with that he states are similar to proceed with surgery -No further cam boot at this time. -A steroid injection was performed at right medial foot using 1% plain Lidocaine and 10 mg of Kenalog. This was well tolerated. -Tri-Lock ankle brace was dispensed   No follow-ups on file.

## 2023-09-30 ENCOUNTER — Encounter: Payer: Managed Care, Other (non HMO) | Admitting: Gastroenterology

## 2023-10-05 ENCOUNTER — Ambulatory Visit: Payer: Managed Care, Other (non HMO) | Admitting: Sports Medicine

## 2023-10-06 ENCOUNTER — Encounter: Payer: Self-pay | Admitting: Podiatry

## 2023-10-07 ENCOUNTER — Other Ambulatory Visit: Payer: Self-pay | Admitting: Podiatry

## 2023-10-07 DIAGNOSIS — M76821 Posterior tibial tendinitis, right leg: Secondary | ICD-10-CM

## 2023-10-08 ENCOUNTER — Other Ambulatory Visit: Payer: Self-pay | Admitting: Podiatry

## 2023-10-08 ENCOUNTER — Telehealth: Payer: Self-pay | Admitting: Podiatry

## 2023-10-08 NOTE — Telephone Encounter (Signed)
Patient came into the office and has asked if there is any other MRI facility that you can send him to. He stated the one you sent him to has a first available of mid December.

## 2023-10-11 ENCOUNTER — Ambulatory Visit
Admission: RE | Admit: 2023-10-11 | Discharge: 2023-10-11 | Disposition: A | Discharge status: Home / Self Care | Payer: Managed Care, Other (non HMO) | Source: Ambulatory Visit | Attending: Podiatry | Admitting: Podiatry

## 2023-10-11 DIAGNOSIS — M76821 Posterior tibial tendinitis, right leg: Secondary | ICD-10-CM

## 2023-10-14 ENCOUNTER — Ambulatory Visit: Payer: Managed Care, Other (non HMO) | Admitting: Family Medicine

## 2023-10-20 ENCOUNTER — Encounter: Payer: Self-pay | Admitting: Podiatry

## 2023-10-20 ENCOUNTER — Ambulatory Visit (INDEPENDENT_AMBULATORY_CARE_PROVIDER_SITE_OTHER): Payer: Managed Care, Other (non HMO) | Admitting: Podiatry

## 2023-10-20 DIAGNOSIS — M76821 Posterior tibial tendinitis, right leg: Secondary | ICD-10-CM

## 2023-10-20 NOTE — Progress Notes (Signed)
Subjective:  Patient ID: Benjamin Singleton, male    DOB: [birth year],  MRN: [id number]  Chief Complaint  Patient presents with   Routine Post Op    PATIENT STATES HE STILL HAS SOME , PATIENT IS WAITING FOR DOCTOR TO READ RESULTS AND GIVE PATIENT FEED BACK , PATIENT PUTS ICE ON FOOT FOR PAIN .    57 y.o. male presents with the above complaint.  Patient presents for follow-up of her right posterior tibial tendinitis.  He states is still hurts about the same the injection did not help much a cam boot has not helped much denies any other acute complaints would like to discuss other treatment options   Review of Systems: Negative except as noted in the HPI. Denies N/V/F/Ch.  Past Medical History:  Diagnosis Date   Gastritis 06/14/2015   GERD (gastroesophageal reflux disease) 06/14/2015   Hypertension 05/24/2019   Sleep apnea     Current Outpatient Medications:    Homeopathic Products (ARNICARE ARNICA) CREA, Apply topically., Disp: , Rfl:    Multiple Vitamin (MULTIVITAMIN WITH MINERALS) TABS tablet, Take 1 tablet by mouth daily., Disp: , Rfl:    tamsulosin (FLOMAX) 0.4 MG CAPS capsule, Take 1 capsule (0.4 mg total) by mouth daily., Disp: 30 capsule, Rfl: 3   fexofenadine (ALLEGRA ALLERGY) 180 MG tablet, Take 1 tablet (180 mg total) by mouth daily for 15 days., Disp: 15 tablet, Rfl: 0  Social History   Tobacco Use  Smoking Status Never  Smokeless Tobacco Never    Allergies  Allergen Reactions   Penicillins Rash   Objective:  There were no vitals filed for this visit. There is no height or weight on file to calculate BMI. Constitutional Well developed. Well nourished.  Vascular Dorsalis pedis pulses palpable bilaterally. Posterior tibial pulses palpable bilaterally. Capillary refill normal to all digits.  No cyanosis or clubbing noted. Pedal hair growth normal.  Neurologic Normal speech. Oriented to person, place, and time. Epicritic sensation to light touch grossly  present bilaterally.  Dermatologic Nails well groomed and normal in appearance. No open wounds. No skin lesions.  Orthopedic: Pain along the course of the posterior tibial tendon pain at the insertion.  Pain with resisted plantar action inversion of the foot no pain with dorsiflexion eversion of the foot no pain at the peroneal tendon Achilles tendon ATFL ligament   Radiographs:  IMPRESSION: 1. Osteochondral lesion versus subcortical stress fracture anteromedially along the talar head with region of involvement measuring about 1.2 by 1.0 cm, and with underlying subcortical marrow edema in the talar head. 2. Mild tibialis posterior and flexor digitorum longus tenosynovitis. 3. Mild distal tibialis posterior tendinopathy, correlate clinically in assessing for tibialis posterior dysfunction. 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 4. Mild common peroneus tendon sheath tenosynovitis. 5. Mild thickening of the medial band of the plantar fascia. 6. Minimal distal Achilles tendinopathy.     Assessment:   No diagnosis found.  Plan:  Patient was evaluated and treated and all questions answered.  Right posterior tibial tendinitis -All questions and concerns were discussed with the patient extensive detail.  Clinically pain is about the same patient has failed steroid injection cam boot immobilization.  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  At this time I discussed PRP injection versus surgical.  Patient would like to do PRP for now -  He will be scheduled to get PRP done   No follow-ups on file.

## 2023-10-26 ENCOUNTER — Other Ambulatory Visit: Payer: Managed Care, Other (non HMO)

## 2023-10-26 NOTE — Progress Notes (Signed)
Orthotic order placed will call patient when in for fitting

## 2023-10-27 ENCOUNTER — Encounter: Payer: Self-pay | Admitting: Podiatry

## 2023-10-27 ENCOUNTER — Ambulatory Visit (INDEPENDENT_AMBULATORY_CARE_PROVIDER_SITE_OTHER): Payer: Managed Care, Other (non HMO) | Admitting: Podiatry

## 2023-10-27 ENCOUNTER — Ambulatory Visit: Payer: Managed Care, Other (non HMO) | Admitting: Podiatry

## 2023-10-27 DIAGNOSIS — M76821 Posterior tibial tendinitis, right leg: Secondary | ICD-10-CM

## 2023-10-27 DIAGNOSIS — M79673 Pain in unspecified foot: Secondary | ICD-10-CM

## 2023-10-27 NOTE — Progress Notes (Signed)
4 cc of PRP injection was given to the medial foot and 1 of maximal tenderness.  No complication noted

## 2023-10-29 ENCOUNTER — Ambulatory Visit: Payer: Managed Care, Other (non HMO) | Admitting: Podiatry

## 2023-11-15 ENCOUNTER — Telehealth: Payer: Self-pay

## 2023-11-15 NOTE — Telephone Encounter (Signed)
CALLED PT TO SCHEDULE ORTHO PICK UP

## 2023-12-08 ENCOUNTER — Other Ambulatory Visit: Payer: Self-pay

## 2023-12-08 ENCOUNTER — Ambulatory Visit
Admission: EM | Admit: 2023-12-08 | Discharge: 2023-12-08 | Disposition: A | Discharge status: Home / Self Care | Payer: Managed Care, Other (non HMO)

## 2023-12-08 DIAGNOSIS — M62830 Muscle spasm of back: Secondary | ICD-10-CM

## 2023-12-08 DIAGNOSIS — M545 Low back pain, unspecified: Secondary | ICD-10-CM

## 2023-12-08 DIAGNOSIS — S39012A Strain of muscle, fascia and tendon of lower back, initial encounter: Secondary | ICD-10-CM | POA: Diagnosis not present

## 2023-12-08 MED ORDER — HYDROCODONE-ACETAMINOPHEN 5-325 MG PO TABS: 1.0000 | ORAL_TABLET | Freq: Three times a day (TID) | ORAL | 0 refills | Status: DC | PRN

## 2023-12-08 MED ORDER — CELECOXIB 200 MG PO CAPS: 200.0000 mg | ORAL_CAPSULE | Freq: Every day | ORAL | 0 refills | Status: AC

## 2023-12-08 MED ORDER — METHOCARBAMOL 500 MG PO TABS: 500.0000 mg | ORAL_TABLET | Freq: Three times a day (TID) | ORAL | 0 refills | Status: DC | PRN

## 2023-12-08 MED ORDER — METHYLPREDNISOLONE SODIUM SUCC 125 MG IJ SOLR
125.0000 mg | Freq: Once | INTRAMUSCULAR | Status: AC
Start: 2023-12-08 — End: 2023-12-08
  Administered 2023-12-08: 125 mg via INTRAMUSCULAR

## 2023-12-08 MED ORDER — PREDNISONE 10 MG (21) PO TBPK: ORAL_TABLET | Freq: Every day | ORAL | 0 refills | Status: DC

## 2023-12-08 NOTE — Discharge Instructions (Addendum)
 Advised patient to take medications as directed with food to completion.  Advised patient to start Sterapred Unipak at 12:30 PM today.  Advised may take prednisone  and Celebrex  concurrently.  Advised may use Robaxin  daily or as needed for accompanying muscle spasms of back.  Advised may use Hycodan for acute lower back pain.  Patient advised of sedative effects of this medication.  Encouraged to increase daily water intake to 64 ounces per day while taking these medications.  Advised if symptoms worsen and/or unresolved please follow-up with PCP or here for further evaluation.

## 2023-12-08 NOTE — ED Provider Notes (Signed)
 [facility]    CSN: [id number] Arrival date & time: 12/08/23  4098      History   Chief Complaint No chief complaint on file.   HPI Benjamin Singleton is a 58 y.o. male.   HPI 58 year old male presents with lower back pain for 4-5 days.  Patient believes he may have pulled a muscle PMH significant for morbid obesity, lumbar degenerative disc disease HTN, and sleep apnea.  Past Medical History:  Diagnosis Date   Gastritis 06/14/2015   GERD (gastroesophageal reflux disease) 06/14/2015   Hypertension 05/24/2019   Sleep apnea     Patient Active Problem List   Diagnosis Date Noted   Urinary frequency 08/10/2023   Morbid obesity (HCC) 08/10/2023   Left lower quadrant abdominal pain 03/31/2023   Stress fracture of navicular bone of right foot 07/07/2022   Right knee pain 02/15/2022   Prediabetes 02/15/2022   Lumbar degenerative disc disease 09/02/2021   Hypertension 05/24/2019   Actinic keratosis 05/04/2019   Snores 06/14/2015   Dizziness and giddiness 07/24/2014    History reviewed. No pertinent surgical history.     Home Medications    Prior to Admission medications   Medication Sig Start Date End Date Taking? Authorizing Provider  celecoxib  (CELEBREX ) 200 MG capsule Take 1 capsule (200 mg total) by mouth daily for 15 days. 12/08/23 12/23/23 Yes Leonides Ramp, FNP  HYDROcodone -acetaminophen  (NORCO/VICODIN) 5-325 MG tablet Take 1 tablet by mouth every 8 (eight) hours as needed. 12/08/23  Yes Leonides Ramp, FNP  loratadine (CLARITIN) 10 MG tablet Take 10 mg by mouth daily.   Yes [provider]  methocarbamol  (ROBAXIN ) 500 MG tablet Take 1 tablet (500 mg total) by mouth 3 (three) times daily as needed. 12/08/23  Yes Leonides Ramp, FNP  predniSONE  (STERAPRED UNI-PAK 21 TAB) 10 MG (21) TBPK tablet Take by mouth daily. Take 6 tabs by mouth daily  for 2 days, then 5 tabs for 2 days, then 4 tabs for 2 days, then 3 tabs for 2 days, 2 tabs for 2 days, then  1 tab by mouth daily for 2 days 12/08/23  Yes Leonides Ramp, FNP  Homeopathic Products (ARNICARE ARNICA) CREA Apply topically.    [provider]  Multiple Vitamin (MULTIVITAMIN WITH MINERALS) TABS tablet Take 1 tablet by mouth daily.    [provider]  tamsulosin  (FLOMAX ) 0.4 MG CAPS capsule Take 1 capsule (0.4 mg total) by mouth daily. 08/10/23   [name], DO    Family History Family History  Problem Relation Age of Onset   Hypertension Mother    Cancer Father        Prostate   Cancer Maternal Aunt        ovarian   Hypertension Other    Diabetes Other    Cancer Other     Social History Social History   Tobacco Use   Smoking status: Never   Smokeless tobacco: Never  Vaping Use   Vaping status: Never Used  Substance Use Topics   Alcohol use: Yes    Comment: social   Drug use: No     Allergies   Penicillins   Review of Systems Review of Systems  Musculoskeletal:  Positive for back pain.     Physical Exam Triage Vital Signs ED Triage Vitals  Encounter Vitals Group     BP      Systolic BP Percentile      Diastolic BP Percentile      Pulse  Resp      Temp      Temp src      SpO2      Weight      Height      Head Circumference      Peak Flow      Pain Score      Pain Loc      Pain Education      Exclude from Growth Chart    No data found.  Updated Vital Signs BP (!) 150/83   Pulse 66   Temp 97.6 F (36.4 C)   Resp 16   SpO2 97%       Physical Exam Vitals and nursing note reviewed.  Constitutional:      Appearance: Normal appearance. He is normal weight.  HENT:     Head: Normocephalic and atraumatic.     Right Ear: Tympanic membrane, ear canal and external ear normal.     Left Ear: Tympanic membrane, ear canal and external ear normal.     Mouth/Throat:     Mouth: Mucous membranes are moist.     Pharynx: Oropharynx is clear.  Eyes:     Extraocular Movements: Extraocular movements intact.      Conjunctiva/sclera: Conjunctivae normal.     Pupils: Pupils are equal, round, and reactive to light.  Cardiovascular:     Rate and Rhythm: Normal rate and regular rhythm.     Pulses: Normal pulses.     Heart sounds: Normal heart sounds.  Pulmonary:     Effort: Pulmonary effort is normal.     Breath sounds: Normal breath sounds. No wheezing, rhonchi or rales.  Musculoskeletal:        General: Normal range of motion.     Cervical back: Normal range of motion and neck supple.     Comments: Patient reporting severe lower back pain over inferior central aspect of LS-spine, patient believes this is muscular in nature  Skin:    General: Skin is warm and dry.  Neurological:     General: No focal deficit present.     Mental Status: He is alert and oriented to person, place, and time. Mental status is at baseline.      UC Treatments / Results  Labs (all labs ordered are listed, but only abnormal results are displayed) Labs Reviewed - No data to display  EKG   Radiology No results found.  Procedures Procedures (including critical care time)  Medications Ordered in UC Medications  methylPREDNISolone  sodium succinate (SOLU-MEDROL ) 125 mg/2 mL injection 125 mg (125 mg Intramuscular Given 12/08/23 0911)    Initial Impression / Assessment and Plan / UC Course  I have reviewed the triage vital signs and the nursing notes.  Pertinent labs & imaging results that were available during my care of the patient were reviewed by me and considered in my medical decision making (see chart for details).     MDM: 1.  Acute bilateral low back pain with sciatica-IM Solu-Medrol  given once in clinic and prior to discharge, Rx'd Norco 5/325 mg tablet: Take 1 tablet every 8 hours for acute pain; 2.  Strain of lumbar region, initial encounter--Rx'd Sterapred Unipak (tapering from 60 mg to 10 mg over 10 days), Rx'd Celebrex  200 mg capsule: Take 1 capsule daily x 15 days; 3.  Spasm of muscle of lower  back-Rx'd Robaxin  500 mg tablet: Take 1 tablet 3 times daily, as needed for accompanying back spasms. Advised patient to take medications as directed with food  to completion.  Advised patient to start Sterapred Unipak at 12:30 PM today.  Advised may take prednisone  and Celebrex  concurrently.  Advised may use Robaxin  daily or as needed for accompanying muscle spasms of back.  Advised may use Hycodan for acute lower back pain.  Patient advised of sedative effects of this medication.  Encouraged to increase daily water intake to 64 ounces per day while taking these medications.  Advised if symptoms worsen and/or unresolved please follow-up with PCP or here for further evaluation. Final Clinical Impressions(s) / UC Diagnoses   Final diagnoses:  Acute bilateral low back pain without sciatica  Strain of lumbar region, initial encounter  Spasm of muscle of lower back     Discharge Instructions      Advised patient to take medications as directed with food to completion.  Advised patient to start Sterapred Unipak at 12:30 PM today.  Advised may take prednisone  and Celebrex  concurrently.  Advised may use Robaxin  daily or as needed for accompanying muscle spasms of back.  Advised may use Hycodan for acute lower back pain.  Patient advised of sedative effects of this medication.  Encouraged to increase daily water intake to 64 ounces per day while taking these medications.  Advised if symptoms worsen and/or unresolved please follow-up with PCP or here for further evaluation.     ED Prescriptions     Medication Sig Dispense Auth. Provider   predniSONE  (STERAPRED UNI-PAK 21 TAB) 10 MG (21) TBPK tablet Take by mouth daily. Take 6 tabs by mouth daily  for 2 days, then 5 tabs for 2 days, then 4 tabs for 2 days, then 3 tabs for 2 days, 2 tabs for 2 days, then 1 tab by mouth daily for 2 days 42 tablet Leonides Ramp, FNP   celecoxib  (CELEBREX ) 200 MG capsule Take 1 capsule (200 mg total) by mouth daily for 15  days. 15 capsule Maylea Soria, FNP   methocarbamol  (ROBAXIN ) 500 MG tablet Take 1 tablet (500 mg total) by mouth 3 (three) times daily as needed. 30 tablet Saga Balthazar, FNP   HYDROcodone -acetaminophen  (NORCO/VICODIN) 5-325 MG tablet Take 1 tablet by mouth every 8 (eight) hours as needed. 15 tablet Xitlalic Maslin, FNP      I have reviewed the PDMP during this encounter.   Leonides Ramp, FNP 12/08/23 [phone]

## 2023-12-08 NOTE — ED Triage Notes (Signed)
 C/o low back pain since last weekend. Thinks he pulled a muscle. Took tylenol , did not help much. Used arnica cream also.

## 2023-12-10 ENCOUNTER — Ambulatory Visit: Payer: Managed Care, Other (non HMO) | Admitting: Podiatry

## 2023-12-17 ENCOUNTER — Ambulatory Visit (INDEPENDENT_AMBULATORY_CARE_PROVIDER_SITE_OTHER): Payer: Managed Care, Other (non HMO) | Admitting: Podiatry

## 2023-12-17 ENCOUNTER — Encounter: Payer: Self-pay | Admitting: Podiatry

## 2023-12-17 VITALS — BP 135/67 | HR 72 | Resp 18 | Ht 70.0 in | Wt 274.0 lb

## 2023-12-17 DIAGNOSIS — M76821 Posterior tibial tendinitis, right leg: Secondary | ICD-10-CM

## 2023-12-17 DIAGNOSIS — M79673 Pain in unspecified foot: Secondary | ICD-10-CM

## 2023-12-17 NOTE — Progress Notes (Signed)
4 cc of PRP injection was given to the medial foot and 1 of maximal tenderness.  No complication noted

## 2024-01-14 ENCOUNTER — Ambulatory Visit: Payer: Managed Care, Other (non HMO) | Admitting: Podiatry

## 2024-01-20 ENCOUNTER — Ambulatory Visit (INDEPENDENT_AMBULATORY_CARE_PROVIDER_SITE_OTHER): Payer: Managed Care, Other (non HMO) | Admitting: Podiatry

## 2024-01-20 DIAGNOSIS — M76821 Posterior tibial tendinitis, right leg: Secondary | ICD-10-CM

## 2024-01-20 MED ORDER — METHYLPREDNISOLONE 4 MG PO TBPK: ORAL_TABLET | ORAL | 0 refills | Status: DC

## 2024-01-20 MED ORDER — MELOXICAM 15 MG PO TABS: 15.0000 mg | ORAL_TABLET | Freq: Every day | ORAL | 0 refills | Status: DC

## 2024-01-20 NOTE — Progress Notes (Signed)
 Subjective:  Patient ID: Benjamin Singleton, male    DOB: 1966-08-17,  MRN: [id number]  No chief complaint on file.   58 y.o. male presents with the above complaint.  Patient presents for follow-up of right posterior tibial tendinitis he states injection helped however he has gained a lot of weight when he says he will go going back home and that made the pain worse he was doing much better when he was not at home   Review of Systems: Negative except as noted in the HPI. Denies N/V/F/Ch.  Past Medical History:  Diagnosis Date   Gastritis 06/14/2015   GERD (gastroesophageal reflux disease) 06/14/2015   Hypertension 05/24/2019   Sleep apnea     Current Outpatient Medications:    meloxicam (MOBIC) 15 MG tablet, Take 1 tablet (15 mg total) by mouth daily., Disp: 30 tablet, Rfl: 0   methylPREDNISolone (MEDROL DOSEPAK) 4 MG TBPK tablet, Take as directed, Disp: 21 each, Rfl: 0   Homeopathic Products (ARNICARE ARNICA) CREA, Apply topically., Disp: , Rfl:    HYDROcodone-acetaminophen (NORCO/VICODIN) 5-325 MG tablet, Take 1 tablet by mouth every 8 (eight) hours as needed., Disp: 15 tablet, Rfl: 0   loratadine (CLARITIN) 10 MG tablet, Take 10 mg by mouth daily., Disp: , Rfl:    methocarbamol (ROBAXIN) 500 MG tablet, Take 1 tablet (500 mg total) by mouth 3 (three) times daily as needed., Disp: 30 tablet, Rfl: 0   Multiple Vitamin (MULTIVITAMIN WITH MINERALS) TABS tablet, Take 1 tablet by mouth daily., Disp: , Rfl:    predniSONE (STERAPRED UNI-PAK 21 TAB) 10 MG (21) TBPK tablet, Take by mouth daily. Take 6 tabs by mouth daily  for 2 days, then 5 tabs for 2 days, then 4 tabs for 2 days, then 3 tabs for 2 days, 2 tabs for 2 days, then 1 tab by mouth daily for 2 days, Disp: 42 tablet, Rfl: 0   tamsulosin (FLOMAX) 0.4 MG CAPS capsule, Take 1 capsule (0.4 mg total) by mouth daily., Disp: 30 capsule, Rfl: 3  Social History   Tobacco Use  Smoking Status Never  Smokeless Tobacco Never    Allergies   Allergen Reactions   Penicillins Rash   Objective:  There were no vitals filed for this visit. There is no height or weight on file to calculate BMI. Constitutional Well developed. Well nourished.  Vascular Dorsalis pedis pulses palpable bilaterally. Posterior tibial pulses palpable bilaterally. Capillary refill normal to all digits.  No cyanosis or clubbing noted. Pedal hair growth normal.  Neurologic Normal speech. Oriented to person, place, and time. Epicritic sensation to light touch grossly present bilaterally.  Dermatologic Nails well groomed and normal in appearance. No open wounds. No skin lesions.  Orthopedic: Pain along the course of the posterior tibial tendon pain at the insertion.  Pain with resisted plantar action inversion of the foot no pain with dorsiflexion eversion of the foot no pain at the peroneal tendon Achilles tendon ATFL ligament   Radiographs: None Assessment:   No diagnosis found.  Plan:  Patient was evaluated and treated and all questions answered.  Right posterior tibial tendinitis -All questions and concerns were discussed with the patient extensive detail.  Patient failed multiple PRP injection as well.  I encouraged him to work on his BMI and decrease weight gain.  The weight gain is likely leading to further stress on the posterior tibial tendon he states understanding will do so immediately -Continue bracing -If it does not get any better we will  discuss other surgical options   No follow-ups on file.

## 2024-01-25 ENCOUNTER — Ambulatory Visit: Admitting: Sports Medicine

## 2024-01-25 ENCOUNTER — Ambulatory Visit

## 2024-01-25 ENCOUNTER — Encounter: Payer: Self-pay | Admitting: Sports Medicine

## 2024-01-25 DIAGNOSIS — M1712 Unilateral primary osteoarthritis, left knee: Secondary | ICD-10-CM | POA: Diagnosis not present

## 2024-01-25 DIAGNOSIS — M1711 Unilateral primary osteoarthritis, right knee: Secondary | ICD-10-CM

## 2024-01-25 NOTE — Progress Notes (Signed)
    Procedures performed today:    None.  Independent interpretation of notes and tests performed by another provider:   X-rays personally reviewed, there is mild to moderate osteoarthritis both knees.  Brief History, Exam, Impression, and Recommendations:    Primary osteoarthritis of left knee Very pleasant 58 year old male, chronic pain left knee medial aspect, he does have a history of knee osteoarthritis diagnosed by Dr. Denyse Amass in the past. Today he has a mild effusion, tenderness medial joint line. We will add home PT, x-rays. Meloxicam already called in by his podiatrist. Return to see me in 1 to 2 weeks, injection if not better.  Chronic process with exacerbation, personal review of an external test.  ____________________________________________ Benjamin Singleton. Benjamin Stain, M.D., ABFM., CAQSM., AME. Primary Care and Sports Medicine [facility]  Adjunct Professor of Family Medicine  [place]  Restaurant manager, fast food

## 2024-01-25 NOTE — Assessment & Plan Note (Signed)
 Very pleasant 58 year old male, chronic pain left knee medial aspect, he does have a history of knee osteoarthritis diagnosed by Dr. Denyse Amass in the past. Today he has a mild effusion, tenderness medial joint line. We will add home PT, x-rays. Meloxicam already called in by his podiatrist. Return to see me in 1 to 2 weeks, injection if not better.

## 2024-01-26 ENCOUNTER — Ambulatory Visit: Payer: Managed Care, Other (non HMO) | Admitting: Podiatry

## 2024-02-10 ENCOUNTER — Ambulatory Visit: Admitting: Sports Medicine

## 2024-02-14 ENCOUNTER — Ambulatory Visit: Payer: Self-pay

## 2024-02-14 NOTE — Telephone Encounter (Signed)
  Chief Complaint: hematuria Symptoms: intermittent LLQ abdominal pain, nocturia Frequency: occurred once on 02/11/24 Pertinent Negatives: Patient denies fever, pain with urination, blood clots in urine, back pain Disposition: [] ED /[] Urgent Care (no appt availability in office) / [x] Appointment(In office/virtual)/ []  [place] [facility]/ [] Home Care/ [] Refused Recommended Disposition /[] [place] [facility]/ []  Follow-up with PCP Additional Notes: Patient states he was sent to a urologist ([place] and he had an MRI, told him he has an enlarged prostate back in December. He states he was placed on Flomax in the past by Dr Ashley Royalty; he states when [place] was [place] he was placed on a different medication. He would like a referral to urology. Patient requesting acute visit for Wednesday; advised due to symptoms he should be sooner. Patient agreeable to afternoon appt with PCP tomorrow. Instructed patient to bring his medications with him.  Copied from CRM [id number]. Topic: Clinical - Red Word Triage >> Feb 14, 2024 11:35 AM Albin Felling L wrote: Red Word that prompted transfer to Nurse Triage: blood in urine Reason for Disposition  Blood in urine  (Exception: Could be normal menstrual bleeding.)  Answer Assessment - Initial Assessment Questions 1. COLOR of URINE: "Describe the color of the urine."  (e.g., tea-colored, pink, red, bloody) "Do you have blood clots in your urine?" (e.g., none, pea, grape, small coin)     Red blood in urine.  2. ONSET: "When did the bleeding start?"      Friday 02/11/24.  3. EPISODES: "How many times has there been blood in the urine?" or "How many times today?"     One episode on 02/11/24.  4. PAIN with URINATION: "Is there any pain with passing your urine?" If Yes, ask: "How bad is the pain?"  (Scale 1-10; or mild, moderate, severe)    - MILD: Complains slightly about urination hurting.    - MODERATE: Interferes with normal activities.       - SEVERE: Excruciating, unwilling or unable to urinate because of the pain.      Denies.  5. FEVER: "Do you have a fever?" If Yes, ask: "What is your temperature, how was it measured, and when did it start?"     Denies.  6. ASSOCIATED SYMPTOMS: "Are you passing urine more frequently than usual?"     He states sometimes yes and at night he has to urinate 5 times.  7. OTHER SYMPTOMS: "Do you have any other symptoms?" (e.g., back/flank pain, abdomen pain, vomiting)     Left lower abdominal pain intermittent.  8. PREGNANCY: "Is there any chance you are pregnant?" "When was your last menstrual period?"     N/A.  Protocols used: Urine - Blood In-A-AH

## 2024-02-15 ENCOUNTER — Encounter: Payer: Self-pay | Admitting: Family Medicine

## 2024-02-15 ENCOUNTER — Ambulatory Visit: Admitting: Family Medicine

## 2024-02-15 VITALS — BP 135/80 | HR 79 | Ht 70.0 in | Wt 276.0 lb

## 2024-02-15 DIAGNOSIS — R319 Hematuria, unspecified: Secondary | ICD-10-CM

## 2024-02-15 DIAGNOSIS — N400 Enlarged prostate without lower urinary tract symptoms: Secondary | ICD-10-CM | POA: Diagnosis not present

## 2024-02-15 DIAGNOSIS — R7303 Prediabetes: Secondary | ICD-10-CM | POA: Diagnosis not present

## 2024-02-15 DIAGNOSIS — Z23 Encounter for immunization: Secondary | ICD-10-CM

## 2024-02-15 LAB — POCT URINALYSIS DIP (CLINITEK)
Bilirubin, UA: NEGATIVE
Blood, UA: NEGATIVE
Glucose, UA: NEGATIVE mg/dL
Ketones, POC UA: NEGATIVE mg/dL
Leukocytes, UA: NEGATIVE
Nitrite, UA: NEGATIVE
POC PROTEIN,UA: NEGATIVE
Spec Grav, UA: 1.025 (ref 1.010–1.025)
Urobilinogen, UA: 0.2 U/dL
pH, UA: 6 (ref 5.0–8.0)

## 2024-02-15 MED ORDER — TAMSULOSIN HCL 0.4 MG PO CAPS: 0.4000 mg | ORAL_CAPSULE | Freq: Every day | ORAL | 3 refills | Status: DC

## 2024-02-15 MED ORDER — DUTASTERIDE 0.5 MG PO CAPS: 0.5000 mg | ORAL_CAPSULE | Freq: Every day | ORAL | 3 refills | Status: DC

## 2024-02-15 NOTE — Assessment & Plan Note (Signed)
 History of BPH and recent hematuria.  UA today with .  Sending for culture.  He has good improvement with combo of dutasteride and tamsulosin.  Discussed that combo not available in [place] but will prescribe separate components.  May need urology referral if symptoms persist.

## 2024-02-15 NOTE — Progress Notes (Signed)
 Benjamin Singleton - 58 y.o. male MRN [id number]  Date of birth: 1966-05-05  Subjective No chief complaint on file.   HPI Benjamin Singleton is a 58 y.o. male here today with complaint of hematuria.  He has had hematuria in the past and told this was likely related to BPH.  He wast placed on Flomax with improvement of his symptoms.  He had an episode a few days ago where he had blood in his urine.  He has not had increased pain but did have some urgency.  Denies fever, chills, perineal pain.  He did received Combodart (dutasteride 0.5mg /Tamsulosin 0.4mg  combination) while in [place].  This seems to work better than tamsulosin monotherapy.  He does also report having and MRI previously and was told that his prostate was enlarged.  He has not noted any blood in his urine since Friday (4 days ago)  ROS:  A comprehensive ROS was completed and negative except as noted per HPI  Allergies  Allergen Reactions   Penicillins Rash    Past Medical History:  Diagnosis Date   Gastritis 06/14/2015   GERD (gastroesophageal reflux disease) 06/14/2015   Hypertension 05/24/2019   Sleep apnea     History reviewed. No pertinent surgical history.  Social History   Socioeconomic History   Marital status: Married    Spouse name: Not on file   Number of children: Not on file   Years of education: Not on file   Highest education level: Bachelor's degree (e.g., BA, AB, BS)  Occupational History   Not on file  Tobacco Use   Smoking status: Never   Smokeless tobacco: Never  Vaping Use   Vaping status: Never Used  Substance and Sexual Activity   Alcohol use: Yes    Comment: social   Drug use: No   Sexual activity: Yes  Other Topics Concern   Not on file  Social History Narrative   Not on file   Social Drivers of Health   Financial Resource Strain: Low Risk  (03/31/2023)   Overall Financial Resource Strain (CARDIA)    Difficulty of Paying Living Expenses: Not hard at all  Food Insecurity: No  Food Insecurity (03/31/2023)   Hunger Vital Sign    Worried About Running Out of Food in the Last Year: Never true    Ran Out of Food in the Last Year: Never true  Transportation Needs: No Transportation Needs (03/31/2023)   PRAPARE - Administrator, Civil Service (Medical): No    Lack of Transportation (Non-Medical): No  Physical Activity: Sufficiently Active (03/31/2023)   Exercise Vital Sign    Days of Exercise per Week: 4 days    Minutes of Exercise per Session: 60 min  Stress: Stress Concern Present (03/31/2023)   [employer] of Occupational Health - Occupational Stress Questionnaire    Feeling of Stress : Rather much  Social Connections: Moderately Integrated (03/31/2023)   Social Connection and Isolation Panel [NHANES]    Frequency of Communication with Friends and Family: Twice a week    Frequency of Social Gatherings with Friends and Family: Once a week    Attends Religious Services: Never    Database administrator or Organizations: Yes    Attends Engineer, structural: More than 4 times per year    Marital Status: Married    Family History  Problem Relation Age of Onset   Hypertension Mother    Cancer Father        Prostate  Cancer Maternal Aunt        ovarian   Hypertension Other    Diabetes Other    Cancer Other     Health Maintenance  Topic Date Due   Zoster Vaccines- Shingrix (1 of 2) Never done   COVID-19 Vaccine (8 - Pfizer risk 2024-25 season) 02/07/2024   Colonoscopy  08/09/2024 (Originally 11/13/2009)   Hepatitis C Screening  08/09/2024 (Originally 11/13/1982)   HIV Screening  08/09/2024 (Originally 11/14/1979)   DTaP/Tdap/Td (2 - Td or Tdap) 10/31/2028   INFLUENZA VACCINE  Completed   HPV VACCINES  Aged Out     ----------------------------------------------------------------------------------------------------------------------------------------------------------------------------------------------------------------- Physical  Exam There were no vitals taken for this visit.  Physical Exam Constitutional:      Appearance: Normal appearance.  Neurological:     Mental Status: He is alert.  Psychiatric:        Mood and Affect: Mood normal.        Behavior: Behavior normal.     ------------------------------------------------------------------------------------------------------------------------------------------------------------------------------------------------------------------- Assessment and Plan  BPH (benign prostatic hyperplasia) History of BPH and recent hematuria.  UA today with .  Sending for culture.  He has good improvement with combo of dutasteride and tamsulosin.  Discussed that combo not available in [place] but will prescribe separate components.  May need urology referral if symptoms persist.    Meds ordered this encounter  Medications   dutasteride (AVODART) 0.5 MG capsule    Sig: Take 1 capsule (0.5 mg total) by mouth daily.    Dispense:  90 capsule    Refill:  3   tamsulosin (FLOMAX) 0.4 MG CAPS capsule    Sig: Take 1 capsule (0.4 mg total) by mouth daily.    Dispense:  30 capsule    Refill:  3    No follow-ups on file.    This visit occurred during the SARS-CoV-2 public health emergency.  Safety protocols were in place, including screening questions prior to the visit, additional usage of staff PPE, and extensive cleaning of exam room while observing appropriate contact time as indicated for disinfecting solutions.

## 2024-02-16 LAB — PSA: Prostate Specific Ag, Serum: 1.2 ng/mL (ref 0.0–4.0)

## 2024-02-16 LAB — HEMOGLOBIN A1C
Est. average glucose Bld gHb Est-mCnc: 126 mg/dL
Hgb A1c MFr Bld: 6 % — ABNORMAL HIGH (ref 4.8–5.6)

## 2024-02-16 LAB — SPECIMEN STATUS REPORT

## 2024-02-16 NOTE — Telephone Encounter (Signed)
 Patient seen 02/15/24  by Dr. Darrin Nipper.

## 2024-02-16 NOTE — Addendum Note (Signed)
 Addended by: Ardyth Man on: 02/16/2024 03:58 PM   Modules accepted: Orders

## 2024-02-18 ENCOUNTER — Ambulatory Visit (INDEPENDENT_AMBULATORY_CARE_PROVIDER_SITE_OTHER): Admitting: Sports Medicine

## 2024-02-18 ENCOUNTER — Ambulatory Visit: Payer: Managed Care, Other (non HMO) | Admitting: Podiatry

## 2024-02-18 ENCOUNTER — Other Ambulatory Visit (INDEPENDENT_AMBULATORY_CARE_PROVIDER_SITE_OTHER): Payer: Self-pay

## 2024-02-18 ENCOUNTER — Encounter: Payer: Self-pay | Admitting: Sports Medicine

## 2024-02-18 DIAGNOSIS — Q742 Other congenital malformations of lower limb(s), including pelvic girdle: Secondary | ICD-10-CM

## 2024-02-18 DIAGNOSIS — M1712 Unilateral primary osteoarthritis, left knee: Secondary | ICD-10-CM

## 2024-02-18 MED ORDER — TRIAMCINOLONE ACETONIDE 40 MG/ML IJ SUSP
40.0000 mg | Freq: Once | INTRAMUSCULAR | Status: AC
  Administered 2024-02-18: 40 mg via INTRAMUSCULAR

## 2024-02-18 NOTE — Assessment & Plan Note (Signed)
 This is a pleasant 58 year old male, known knee osteoarthritis, failed conservative treatment, we did a injection left knee today, return to see me in 6 weeks.

## 2024-02-18 NOTE — Assessment & Plan Note (Signed)
 Benjamin Singleton has also had chronic right foot and ankle pain, he localizes the pain directly at the navicular prominence. No tenderness at the tibialis posterior. This has been hurting him for about a year now, he has not had multiple modalities of treatment, initially treated with immobilization in a boot for 4 weeks, this helped, he stopped the boot and then the pain came back. He restarted the boot for several weeks and continues to have recurrence of discomfort when he comes out of it. He has done home conditioning for the tibialis posterior. He has seen a podiatrist and it sounds like has had a couple of injections with podiatry. 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 Pain continues to be at the navicular, he has no tenderness over the talar dome. Unfortunately continues to have discomfort. At this point I think is reasonable to get a second surgical opinion with Dr. Susa Simmonds.

## 2024-02-18 NOTE — Progress Notes (Signed)
    Procedures performed today:    Procedure: Real-time Ultrasound Guided injection of the left knee Device: Samsung HS60  Verbal informed consent obtained.  Time-out conducted.  Noted no overlying erythema, induration, or other signs of local infection.  Skin prepped in a sterile fashion.  Local anesthesia: Topical Ethyl chloride.  With sterile technique and under real time ultrasound guidance: Mild effusion noted, 1 cc Kenalog 40, 2 cc lidocaine, 2 cc bupivacaine injected easily Completed without difficulty  Advised to call if fevers/chills, erythema, induration, drainage, or persistent bleeding.  Images permanently stored and available for review in PACS.  Impression: Technically successful ultrasound guided injection.  Independent interpretation of notes and tests performed by another provider:   None.  Brief History, Exam, Impression, and Recommendations:    Primary osteoarthritis of left knee This is a pleasant 58 year old male, known knee osteoarthritis, failed conservative treatment, we did a injection left knee today, return to see me in 6 weeks.  Accessory navicular bone of right foot Benjamin Singleton has also had chronic right foot and ankle pain, he localizes the pain directly at the navicular prominence. No tenderness at the tibialis posterior. This has been hurting him for about a year now, he has not had multiple modalities of treatment, initially treated with immobilization in a boot for 4 weeks, this helped, he stopped the boot and then the pain came back. He restarted the boot for several weeks and continues to have recurrence of discomfort when he comes out of it. He has done home conditioning for the tibialis posterior. He has seen a podiatrist and it sounds like has had a couple of injections with podiatry. 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 Pain continues to be at the navicular, he  has no tenderness over the talar dome. Unfortunately continues to have discomfort. At this point I think is reasonable to get a second surgical opinion with Dr. Susa Simmonds.    ____________________________________________ Ihor Austin. Benjamin Stain, M.D., ABFM., CAQSM., AME. Primary Care and Sports Medicine [facility]  Adjunct Professor of Family Medicine  [employer]  Restaurant manager, fast food

## 2024-02-18 NOTE — Addendum Note (Signed)
 Addended by: Samule Dry on: 02/18/2024 04:28 PM   Modules accepted: Orders

## 2024-02-23 ENCOUNTER — Other Ambulatory Visit: Payer: Self-pay

## 2024-02-23 ENCOUNTER — Ambulatory Visit
Admission: EM | Admit: 2024-02-23 | Discharge: 2024-02-23 | Disposition: A | Discharge status: Home / Self Care | Attending: Family Medicine | Admitting: Family Medicine

## 2024-02-23 DIAGNOSIS — J069 Acute upper respiratory infection, unspecified: Secondary | ICD-10-CM

## 2024-02-23 LAB — POC SARS CORONAVIRUS 2 AG -  ED: SARS Coronavirus 2 Ag: NEGATIVE

## 2024-02-23 MED ORDER — AZITHROMYCIN 250 MG PO TABS: ORAL_TABLET | ORAL | 0 refills | Status: DC

## 2024-02-23 MED ORDER — PREDNISONE 20 MG PO TABS: 20.0000 mg | ORAL_TABLET | Freq: Two times a day (BID) | ORAL | 0 refills | Status: DC

## 2024-02-23 NOTE — ED Triage Notes (Signed)
 Has been ill since Monday, has c/o coughing. Yesterday was coughing up green sputum, today it has been yellow. Has been taking mucinex. No fever.

## 2024-02-23 NOTE — Discharge Instructions (Addendum)
 Drink lots of fluids May continue the Mucinex or Mucinex DM Take prednisone 2 times a day for 5 days If you fail to see improvement by the weekend, fill and take the azithromycin antibiotic Call here or see your doctor if needed

## 2024-02-23 NOTE — ED Provider Notes (Signed)
 [facility]    CSN: [id number] Arrival date & time: 02/23/24  4098      History   Chief Complaint Chief Complaint  Patient presents with   Cough    HPI Benjamin Singleton is a 58 y.o. male.   HPI Patient works as a Catering manager.  He is around children a lot and many of them are sick.  Currently has cough, sinus congestion, postnasal drip, sore throat, yellow-green mucus since Monday.  He states that he feels like he may have a sinus or chest infection.  Is here for evaluation.  He has had COVID vaccinations.  No headache, body aches, fatigue or weakness.  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 No pertinent surgical history.     Home Medications    Prior to Admission medications   Medication Sig Start Date End Date Taking? Authorizing Provider  azithromycin (ZITHROMAX Z-PAK) 250 MG tablet Take two pills today followed by one a day until gone 02/23/24  Yes Eustace Moore, MD  predniSONE (DELTASONE) 20 MG tablet Take 1 tablet (20 mg total) by mouth 2 (two) times daily with a meal. 02/23/24  Yes Eustace Moore, MD  dutasteride (AVODART) 0.5 MG capsule Take 1 capsule (0.5 mg total) by mouth daily. 02/15/24   Everrett Coombe, DO  Homeopathic Products (ARNICARE ARNICA) CREA Apply  topically.    [provider]  loratadine (CLARITIN) 10 MG tablet Take 10 mg by mouth daily.    [provider]  Multiple Vitamin (MULTIVITAMIN WITH MINERALS) TABS tablet Take 1 tablet by mouth daily.    [provider]  tamsulosin (FLOMAX) 0.4 MG CAPS capsule Take 1 capsule (0.4 mg total) by mouth daily. 02/15/24   Everrett Coombe, DO    Family History Family History  Problem Relation Age of Onset   Hypertension Mother    Cancer Father        Prostate   Cancer Maternal Aunt        ovarian   Hypertension Other    Diabetes Other    Cancer Other     Social History Social History   Tobacco Use   Smoking status: Never   Smokeless tobacco: Never  Vaping Use   Vaping status: Never Used  Substance Use Topics   Alcohol use: Yes    Comment: social   Drug use: No     Allergies   Penicillins   Review of Systems Review of Systems See HPI  Physical Exam Triage Vital Signs ED Triage Vitals  Encounter Vitals Group     BP 02/23/24 0926 138/78     Systolic BP Percentile --      Diastolic BP Percentile --      Pulse Rate 02/23/24 0926 75     Resp 02/23/24 0926 16  Temp 02/23/24 0926 98 F (36.7 C)     Temp src --      SpO2 02/23/24 0926 97 %     Weight --      Height --      Head Circumference --      Peak Flow --      Pain Score 02/23/24 0930 0     Pain Loc --      Pain Education --      Exclude from Growth Chart --    No data found.  Updated Vital Signs BP 138/78   Pulse 75   Temp 98 F (36.7 C)   Resp 16   SpO2 97%       Physical Exam Constitutional:      General: He is not in acute distress.    Appearance: He is well-developed. He is obese. He is ill-appearing.  HENT:     Head: Normocephalic and atraumatic.  Eyes:     Conjunctiva/sclera: Conjunctivae normal.     Pupils: Pupils are equal, round, and reactive to light.  Cardiovascular:     Rate and Rhythm: Normal rate.  Pulmonary:     Effort: Pulmonary effort is  normal. No respiratory distress.  Abdominal:     General: There is no distension.     Palpations: Abdomen is soft.  Musculoskeletal:        General: Normal range of motion.     Cervical back: Normal range of motion.  Skin:    General: Skin is warm and dry.  Neurological:     Mental Status: He is alert.      UC Treatments / Results  Labs (all labs ordered are listed, but only abnormal results are displayed) Labs Reviewed  POC SARS CORONAVIRUS 2 AG -  ED    EKG   Radiology No results found.  Procedures Procedures (including critical care time)  Medications Ordered in UC Medications - No data to display  Initial Impression / Assessment and Plan / UC Course  I have reviewed the triage vital signs and the nursing notes.  Pertinent labs & imaging results that were available during my care of the patient were reviewed by me and considered in my medical decision making (see chart for details).     COVID test is negative.  Explained to the patient that even though he is having yellow and green mucus, with 4 days of symptoms he is likely suffering from a viral upper respiratory infection.  Patient feels that antibiotic will be indicated.  We discussed that he will take an antibiotic if he fails to improve by the weekend Final Clinical Impressions(s) / UC Diagnoses   Final diagnoses:  Acute upper respiratory infection     Discharge Instructions      Drink lots of fluids May continue the Mucinex or Mucinex DM Take prednisone 2 times a day for 5 days If you fail to see improvement by the weekend, fill and take the azithromycin antibiotic Call here or see your doctor if needed     ED Prescriptions     Medication Sig Dispense Auth. Provider   predniSONE (DELTASONE) 20 MG tablet Take 1 tablet (20 mg total) by mouth 2 (two) times daily with a meal. 10 tablet Eustace Moore, MD   azithromycin (ZITHROMAX Z-PAK) 250 MG tablet Take two pills today followed by one a day  until gone 6 tablet Delton See Letta Pate, MD      PDMP not reviewed this encounter.  Eustace Moore, MD 02/23/24 [phone]

## 2024-02-28 ENCOUNTER — Encounter: Payer: Self-pay | Admitting: Family Medicine

## 2024-02-28 DIAGNOSIS — R351 Nocturia: Secondary | ICD-10-CM

## 2024-03-31 ENCOUNTER — Ambulatory Visit: Admitting: Sports Medicine

## 2024-04-10 ENCOUNTER — Encounter: Payer: Self-pay | Admitting: Family Medicine

## 2024-04-10 ENCOUNTER — Ambulatory Visit: Admitting: Family Medicine

## 2024-04-10 VITALS — BP 148/83 | HR 77 | Ht 70.0 in | Wt 281.0 lb

## 2024-04-10 DIAGNOSIS — R1032 Left lower quadrant pain: Secondary | ICD-10-CM | POA: Diagnosis not present

## 2024-04-10 DIAGNOSIS — G8929 Other chronic pain: Secondary | ICD-10-CM | POA: Diagnosis not present

## 2024-04-10 DIAGNOSIS — L259 Unspecified contact dermatitis, unspecified cause: Secondary | ICD-10-CM | POA: Diagnosis not present

## 2024-04-10 MED ORDER — TRIAMCINOLONE ACETONIDE 0.1 % EX CREA: 1.0000 | TOPICAL_CREAM | Freq: Two times a day (BID) | CUTANEOUS | 0 refills | Status: DC

## 2024-04-10 NOTE — Assessment & Plan Note (Signed)
 Possibly from triple antibiotic ointment or adhesive from bandage.  Adding topical triamcinolone .  Red flags reviewed.  Follow up prn.

## 2024-04-10 NOTE — Progress Notes (Signed)
 Benjamin Singleton - 58 y.o. male MRN [id number]  Date of birth: [birth year]  Subjective Chief Complaint  Patient presents with   Rash    HPI Benjamin Singleton Leber is a 58 y.o. male here today with complaint of rash.    He had small cut to his leg.  He applied an antibiotic cream and covered with a bandaid to help with healing.  After removing the badage he noted itching with rash overlying area.  No drainage or pain associated with this.   He also has some pain in the LLQ.  This comes and goes but he has been dealing with this for quite sometime.  Bowels are moving normally. Concerns about possible hernia   ROS:  A comprehensive ROS was completed and negative except as noted per HPI    Allergies  Allergen Reactions   Penicillins Rash    Past Medical History:  Diagnosis Date   Gastritis 06/14/2015   GERD (gastroesophageal reflux disease) 06/14/2015   Hypertension 05/24/2019   Sleep apnea     History reviewed. No pertinent surgical history.  Social History   Socioeconomic History   Marital status: Married    Spouse name: Not on file   Number of children: Not on file   Years of education: Not on file   Highest education level: Bachelor's degree (e.g., BA, AB, BS)  Occupational History   Not on file  Tobacco Use   Smoking status: Never   Smokeless tobacco: Never  Vaping Use   Vaping status: Never Used  Substance and Sexual Activity   Alcohol use: Yes    Comment: social   Drug use: No   Sexual activity: Yes  Other Topics Concern   Not on file  Social History Narrative   Not on file   Social Drivers of Health   Financial Resource Strain: Low Risk  (03/31/2023)   Overall Financial Resource Strain (CARDIA)    Difficulty of Paying Living Expenses: Not hard at all  Food Insecurity: No Food Insecurity (03/31/2023)   Hunger Vital Sign    Worried About Running Out of Food in the Last Year: Never true    Ran Out of Food in the Last Year: Never true  Transportation  Needs: No Transportation Needs (03/31/2023)   PRAPARE - Administrator, Civil Service (Medical): No    Lack of Transportation (Non-Medical): No  Physical Activity: Sufficiently Active (03/31/2023)   Exercise Vital Sign    Days of Exercise per Week: 4 days    Minutes of Exercise per Session: 60 min  Stress: Stress Concern Present (03/31/2023)   [employer] of Occupational Health - Occupational Stress Questionnaire    Feeling of Stress : Rather much  Social Connections: Moderately Integrated (03/31/2023)   Social Connection and Isolation Panel [NHANES]    Frequency of Communication with Friends and Family: Twice a week    Frequency of Social Gatherings with Friends and Family: Once a week    Attends Religious Services: Never    Database administrator or Organizations: Yes    Attends Engineer, structural: More than 4 times per year    Marital Status: Married    Family History  Problem Relation Age of Onset   Hypertension Mother    Cancer Father        Prostate   Cancer Maternal Aunt        ovarian   Hypertension Other    Diabetes Other    Cancer Other  Health Maintenance  Topic Date Due   Zoster Vaccines- Shingrix (1 of 2) Never done   COVID-19 Vaccine (8 - Pfizer risk 2024-25 season) 02/07/2024   Colonoscopy  08/09/2024 (Originally 11/13/2009)   Hepatitis C Screening  08/09/2024 (Originally 11/13/1982)   HIV Screening  08/09/2024 (Originally 11/14/1979)   INFLUENZA VACCINE  06/23/2024   DTaP/Tdap/Td (2 - Td or Tdap) 10/31/2028   Pneumococcal Vaccine 46-2 Years old  Aged Out   HPV VACCINES  Aged Out   Meningococcal B Vaccine  Aged Out     ----------------------------------------------------------------------------------------------------------------------------------------------------------------------------------------------------------------- Physical Exam BP (!) 148/83 (BP Location: Left Arm, Patient Position: Sitting, Cuff Size: Large)   Pulse  77   Ht 5\' 10"  (1.778 m)   Wt 281 lb (127.5 kg)   SpO2 97%   BMI 40.32 kg/m   Physical Exam Constitutional:      Appearance: Normal appearance.  Eyes:     General: No scleral icterus. Cardiovascular:     Rate and Rhythm: Normal rate and regular rhythm.  Pulmonary:     Effort: Pulmonary effort is normal.     Breath sounds: Normal breath sounds.  Abdominal:     General: There is no distension.     Palpations: Abdomen is soft.     Tenderness: There is no guarding.  Musculoskeletal:     Cervical back: Neck supple.  Skin:    Comments: Square shaped rash overlying L lateral leg.  No swelling or drainage.    Neurological:     Mental Status: He is alert.  Psychiatric:        Mood and Affect: Mood normal.        Behavior: Behavior normal.     ------------------------------------------------------------------------------------------------------------------------------------------------------------------------------------------------------------------- Assessment and Plan  Contact dermatitis Possibly from triple antibiotic ointment or adhesive from bandage.  Adding topical triamcinolone .  Red flags reviewed.  Follow up prn.   Left lower quadrant abdominal pain Continues to have LLQ pain.  CT abdomen and pelvis ordered.    Meds ordered this encounter  Medications   triamcinolone  cream (KENALOG ) 0.1 %    Sig: Apply 1 Application topically 2 (two) times daily.    Dispense:  30 g    Refill:  0    No follow-ups on file.

## 2024-04-10 NOTE — Assessment & Plan Note (Signed)
 Continues to have LLQ pain.  CT abdomen and pelvis ordered.

## 2024-04-18 ENCOUNTER — Ambulatory Visit: Payer: Self-pay | Admitting: Family Medicine

## 2024-04-18 ENCOUNTER — Ambulatory Visit (INDEPENDENT_AMBULATORY_CARE_PROVIDER_SITE_OTHER)

## 2024-04-18 DIAGNOSIS — G8929 Other chronic pain: Secondary | ICD-10-CM

## 2024-04-18 DIAGNOSIS — R1032 Left lower quadrant pain: Secondary | ICD-10-CM | POA: Diagnosis not present

## 2024-04-18 MED ORDER — IOHEXOL 300 MG/ML  SOLN
200.0000 mL | Freq: Once | INTRAMUSCULAR | Status: AC | PRN
  Administered 2024-04-18: 125 mL via INTRAVENOUS

## 2024-05-01 ENCOUNTER — Ambulatory Visit: Admitting: Urology

## 2024-05-01 ENCOUNTER — Ambulatory Visit: Payer: Self-pay | Admitting: *Deleted

## 2024-05-01 NOTE — Telephone Encounter (Signed)
 Requesting to be scheduled with Dr. Elva Hamburger.   Has seen him before.   Call warm transferred into the office to CAL for scheduling.   FYI Only or Action Required?: FYI only for provider  Patient was last seen in primary care on 04/10/2024 by Benjamin Holter, DO. Called Nurse Triage reporting Leg Pain. Symptoms began a week ago. Interventions attempted: Nothing. Symptoms are: unchanged.  Triage Disposition: See HCP Within 4 Hours (Or PCP Triage)  Patient/caregiver understands and will follow disposition?:   Yes      Copied from CRM [id number]. Topic: Clinical - Red Word Triage >> May 01, 2024  9:54 AM Corin V wrote: [facility] that prompted transfer to Nurse Triage: Patient is having inflammation and swelling  in his left thigh and this has been going on for about a week. Reason for Disposition  [1] Thigh or calf pain AND [2] only 1 side AND [3] present > 1 hour (Exception: Chronic unchanged pain.)  Answer Assessment - Initial Assessment Questions 1. ONSET: "When did the pain start?"      My left calf is painful.    It's swelling in my calf for a week.  I would like to see the doctor.   No history of blood clots in legs.   2. LOCATION: "Where is the pain located?"      Left calf 3. PAIN: "How bad is the pain?"    (Scale 1-10; or mild, moderate, severe)   -  MILD (1-3): doesn't interfere with normal activities    -  MODERATE (4-7): interferes with normal activities (e.g., work or school) or awakens from sleep, limping    -  SEVERE (8-10): excruciating pain, unable to do any normal activities, unable to walk     Not red or warm feeling.     4. 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 5. CAUSE: "What do you think is causing the leg pain?"     I don't know 6. OTHER SYMPTOMS: "Do you have any other symptoms?" (e.g., chest pain, back pain, breathing difficulty, swelling, rash,  fever, numbness, weakness)     It's swelling 7. PREGNANCY: "Is there any chance you are pregnant?" "When was your last menstrual period?"     N/A  Protocols used: Leg Pain-A-AH

## 2024-05-01 NOTE — Progress Notes (Signed)
 Chief Complaint: No chief complaint on file.   History of Present Illness:  Benjamin Singleton is a 58 y.o. male who is seen in consultation from [name], DO for evaluation of a bladder calculus.  He presented to his PCP on 19 May with left lower quadrant pain.  CT scan as follows:  CT scan from 04/18/2024: IMPRESSION: 1. An 11 mm stone within the urinary bladder may represent a bladder calculus or less likely a recently passed left renal stone. No hydronephrosis. 2. No bowel obstruction. Normal appendix. 3. Fatty liver.  He has been seen in [place] for a large prostate.  It was possibly recommended that he have a procedure done.  He did not want to do that.  He has been on a short course of what sounds like tamsulosin .  He stopped this due to side effects of him feeling funny.  Earlier this year he did have a episode of gross hematuria.  None since.   Past Medical History:  Past Medical History:  Diagnosis Date   Gastritis 06/14/2015   GERD (gastroesophageal reflux disease) 06/14/2015   Hypertension 05/24/2019   Sleep apnea     Past Surgical History:  No past surgical history on file.  Allergies:  Allergies  Allergen Reactions   Penicillins Rash    Family History:  Family History  Problem Relation Age of Onset   Hypertension Mother    Cancer Father        Prostate   Cancer Maternal Aunt        ovarian   Hypertension Other    Diabetes Other    Cancer Other     Social History:  Social History   Tobacco Use   Smoking status: Never   Smokeless tobacco: Never  Vaping Use   Vaping status: Never Used  Substance Use Topics   Alcohol use: Yes    Comment: social   Drug use: No    Review of symptoms:  Constitutional:  Negative for unexplained weight loss, night sweats, fever, chills ENT:  Negative for nose bleeds, sinus pain, painful swallowing CV:  Negative for chest pain, shortness of breath, exercise intolerance, palpitations, loss of  consciousness Resp:  Negative for cough, wheezing, shortness of breath GI:  Negative for nausea, vomiting, diarrhea, bloody stools GU:  Positives noted in HPI; otherwise negative for gross hematuria, dysuria, urinary incontinence Neuro:  Negative for seizures, poor balance, limb weakness, slurred speech Psych:  Negative for lack of energy, depression, anxiety Endocrine:  Negative for polydipsia, polyuria, symptoms of hypoglycemia (dizziness, hunger, sweating) Hematologic:  Negative for anemia, purpura, petechia, prolonged or excessive bleeding, use of anticoagulants  Allergic:  Negative for difficulty breathing or choking as a result of exposure to anything; no shellfish allergy; no allergic response (rash/itch) to materials, foods  Physical exam: There were no vitals taken for this visit. GENERAL APPEARANCE:  Well appearing, well developed, well nourished, NAD HEENT: Atraumatic, Normocephalic. NECK: Normal appearance LUNGS: Normal inspiratory and expiratory excursion HEART: Regular Rate EXTREMITIES: Moves all extremities well.  Without clubbing, cyanosis, or edema. NEUROLOGIC:  Alert and oriented x 3, normal gait, CN II-XII grossly intact.  MENTAL STATUS:  Appropriate. SKIN:  Warm, dry and intact.    Results: No results found for this or any previous visit (from the past 24 hours).  I have reviewed referring/prior physicians notes  I have reviewed urinalysis  I have reviewed PSA results--most recently 1.2 in March 2025  I have reviewed prior imaging--recent CT  images reviewed.  Estimated prostate volume 80 mL.  Bladder calculus noted.  Hounsfield units of bladder stone 600.   Assessment: -11 mm bladder calculus, currently asymptomatic  -BPH with lower urinary tract symptoms.  Patient does not want medical therapy at this time.  He is on a natural supplement  -History of hematuria, short term, more than likely related to his bladder calculus     Plan: -We discussed  management of his bladder stone.  He does have a significantly low density, and this may be uric acid.  They would rather delay any cystoscopic procedure at this time.  We will start him on potassium citrate 15 mill equivalents 3 times a day  -I did offer BPH treatment.  He like to hold off on that  -I did share with him the fact that he does have a large prostate on ultrasound, but he does have a normal PSA and very low risk for prostate cancer at this point  -I will have him come back in 3 months, after he has been on the potassium citrate, for an office visit following up bladder scan/ultrasound to hopefully measure the size of this 11 mm stone

## 2024-05-01 NOTE — Telephone Encounter (Signed)
 In chart shows Upcoming appt schld June 10th with Dr. Sandy Crumb.

## 2024-05-02 ENCOUNTER — Telehealth: Payer: Self-pay | Admitting: Sports Medicine

## 2024-05-02 ENCOUNTER — Ambulatory Visit

## 2024-05-02 ENCOUNTER — Ambulatory Visit: Payer: Self-pay | Admitting: Sports Medicine

## 2024-05-02 ENCOUNTER — Encounter: Payer: Self-pay | Admitting: Sports Medicine

## 2024-05-02 ENCOUNTER — Ambulatory Visit: Admitting: Sports Medicine

## 2024-05-02 DIAGNOSIS — M79662 Pain in left lower leg: Secondary | ICD-10-CM

## 2024-05-02 DIAGNOSIS — M7989 Other specified soft tissue disorders: Secondary | ICD-10-CM | POA: Diagnosis not present

## 2024-05-02 DIAGNOSIS — M1712 Unilateral primary osteoarthritis, left knee: Secondary | ICD-10-CM | POA: Diagnosis not present

## 2024-05-02 MED ORDER — DICLOFENAC SODIUM 75 MG PO TBEC: 75.0000 mg | DELAYED_RELEASE_TABLET | Freq: Two times a day (BID) | ORAL | 3 refills | Status: DC

## 2024-05-02 NOTE — Progress Notes (Signed)
    Procedures performed today:    None.  Independent interpretation of notes and tests performed by another provider:   None.  Brief History, Exam, Impression, and Recommendations:    Primary osteoarthritis of left knee Inj 2.5 months ago, did well until recently, incr pain and swelling. Was trying a natural approach which didn't work. Adding voltaren  oral. [employer] approval. Needs to lose 75-100lbs. RTC 3-4 weeks.  Left leg swelling 2+ edema, likely 2/2 knee OA. Pos homans sign so adding DVT US .  Morbid obesity (HCC) Needs to lose 75-100lb, this will save his knees and likely his life. Would like another referral to [facility] in [place]. I think he needs to consider GLP1s.    ____________________________________________ Joselyn Nicely. Sandy Crumb, M.D., ABFM., CAQSM., AME. Primary Care and Sports Medicine [facility]  Adjunct Professor of [facility] Medicine  [employer]  Restaurant manager, fast food

## 2024-05-02 NOTE — Telephone Encounter (Signed)
 Bilateral please, failed everything

## 2024-05-02 NOTE — Assessment & Plan Note (Signed)
 Inj 2.5 months ago, did well until recently, incr pain and swelling. Was trying a natural approach which didn't work. Adding voltaren  oral. [employer] approval. Needs to lose 75-100lbs. RTC 3-4 weeks.

## 2024-05-02 NOTE — Assessment & Plan Note (Signed)
 2+ edema, likely 2/2 knee OA. Pos homans sign so adding DVT US .

## 2024-05-02 NOTE — Assessment & Plan Note (Signed)
 Needs to lose 75-100lb, this will save his knees and likely his life. Would like another referral to [facility] in [place]. I think he needs to consider GLP1s.

## 2024-05-03 ENCOUNTER — Ambulatory Visit (INDEPENDENT_AMBULATORY_CARE_PROVIDER_SITE_OTHER): Admitting: Urology

## 2024-05-03 VITALS — BP 157/106 | HR 80 | Ht 70.0 in | Wt 280.0 lb

## 2024-05-03 DIAGNOSIS — N401 Enlarged prostate with lower urinary tract symptoms: Secondary | ICD-10-CM | POA: Diagnosis not present

## 2024-05-03 DIAGNOSIS — R351 Nocturia: Secondary | ICD-10-CM | POA: Diagnosis not present

## 2024-05-03 DIAGNOSIS — Z87898 Personal history of other specified conditions: Secondary | ICD-10-CM

## 2024-05-03 DIAGNOSIS — N210 Calculus in bladder: Secondary | ICD-10-CM

## 2024-05-03 DIAGNOSIS — N138 Other obstructive and reflux uropathy: Secondary | ICD-10-CM

## 2024-05-03 LAB — URINALYSIS, ROUTINE W REFLEX MICROSCOPIC
Bilirubin, UA: NEGATIVE
Glucose, UA: NEGATIVE
Ketones, UA: NEGATIVE
Nitrite, UA: NEGATIVE
Protein,UA: NEGATIVE
RBC, UA: NEGATIVE
Specific Gravity, UA: 1.02 (ref 1.005–1.030)
Urobilinogen, Ur: 0.2 mg/dL (ref 0.2–1.0)
pH, UA: 7 (ref 5.0–7.5)

## 2024-05-03 LAB — MICROSCOPIC EXAMINATION

## 2024-05-03 LAB — BLADDER SCAN AMB NON-IMAGING: Scan Result: 130

## 2024-05-03 MED ORDER — POTASSIUM CITRATE ER 15 MEQ (1620 MG) PO TBCR: EXTENDED_RELEASE_TABLET | ORAL | 11 refills | Status: DC

## 2024-05-03 NOTE — Telephone Encounter (Addendum)
 Benefits Investigation Details received from [employer] Injection: Orthovisc PA required: Yes May fill through: Buy and Bill  OV Copay/Coinsurance: 10% Product Copay: 10% Administration Coinsurance: % Administration Copay: $10 Deductible: $5000 (Met: $5000) Individual Out of Pocket Max: $5000 (Met: $8730.25)  Benefits Investigation Started  Deductible applies. Since deductible has been met, the patient is responsible for a coinsurance. Once the OOP has been met, the patient is covered at 100%.  Prior Authorization is required for the drug through [place].  [employer] Prompt PA (EOC) #: [id number]  Patient has been denied.

## 2024-05-03 NOTE — Telephone Encounter (Signed)
 Submitted benefits investigation through [employer].

## 2024-05-23 ENCOUNTER — Telehealth: Payer: Self-pay

## 2024-05-23 ENCOUNTER — Ambulatory Visit (INDEPENDENT_AMBULATORY_CARE_PROVIDER_SITE_OTHER): Admitting: Sports Medicine

## 2024-05-23 ENCOUNTER — Other Ambulatory Visit (HOSPITAL_COMMUNITY): Payer: Self-pay

## 2024-05-23 DIAGNOSIS — M1712 Unilateral primary osteoarthritis, left knee: Secondary | ICD-10-CM

## 2024-05-23 MED ORDER — ZEPBOUND 2.5 MG/0.5ML ~~LOC~~ SOAJ: 2.5000 mg | SUBCUTANEOUS | 0 refills | Status: DC

## 2024-05-23 NOTE — Progress Notes (Signed)
    Procedures performed today:    None.  Independent interpretation of notes and tests performed by another provider:   None.  Brief History, Exam, Impression, and Recommendations:    Primary osteoarthritis of left knee Benjamin Singleton had an injection about 3 months ago. He was trying a natural approach which did not work. We added Voltaren  oral, worked on [employer], and we discussed weight loss. His knees are doing okay but he understands weight loss is going to be critical here.  Morbid obesity (HCC) Benjamin Singleton needs to lose maybe 75 to 100 pounds, I think this will save his knees and likely save his life. He does have an appointment coming up with [facility] in [place], I am going to also going to get him started on Zepbound.  For insurance purposes Benjamin Singleton will be enrolled in a multidisciplinary weight loss program with calorie counting, and exercise prescription, he has no contraindications to GLP-1's and he will not be on any other weight loss medications. See starting weight in chart. He see either myself or PCP back after a month of the initial weight loss injections and for dose titration.    ____________________________________________ Benjamin Singleton, M.D., ABFM., CAQSM., AME. Primary Care and Sports Medicine [facility]  Adjunct Professor of [facility] Medicine  [employer]  Restaurant manager, fast food

## 2024-05-23 NOTE — Progress Notes (Signed)
 SABRA

## 2024-05-23 NOTE — Assessment & Plan Note (Signed)
 Mang had an injection about 3 months ago. He was trying a natural approach which did not work. We added Voltaren  oral, worked on [employer], and we discussed weight loss. His knees are doing okay but he understands weight loss is going to be critical here.

## 2024-05-23 NOTE — Telephone Encounter (Signed)
 Pharmacy Patient Advocate Encounter  Received notification from [employer] that Prior Authorization for Zepbound 2.5mg /0.59ml has been APPROVED from 05/23/24 to 01/18/25   PA #/Case ID/Reference #: [id number]  Left a message at [facility] to notify of the approval

## 2024-05-23 NOTE — Assessment & Plan Note (Signed)
 Benjamin Singleton needs to lose maybe 75 to 100 pounds, I think this will save his knees and likely save his life. He does have an appointment coming up with [facility] in [place], I am going to also going to get him started on Zepbound.  For insurance purposes [name] will be enrolled in a multidisciplinary weight loss program with calorie counting, and exercise prescription, he has no contraindications to GLP-1's and he will not be on any other weight loss medications. See starting weight in chart. He see either myself or PCP back after a month of the initial weight loss injections and for dose titration.

## 2024-05-23 NOTE — Telephone Encounter (Signed)
 Pharmacy Patient Advocate Encounter   Received notification from Patient Pharmacy that prior authorization for Zepbound 2.5mg /0.29ml is required/requested.   Insurance verification completed.   The patient is insured through [employer] .   Per test claim: PA required; PA submitted to above mentioned insurance via CoverMyMeds Key/confirmation #/EOC [id number] Status is pending

## 2024-05-25 ENCOUNTER — Ambulatory Visit

## 2024-05-25 NOTE — Progress Notes (Signed)
 Patient presented today for an injection teaching for Zepbound. Patient was able to verbalized and demonstrate the instructions on how do prepare the injection site and how to inject the dose properly. Patient has chosen to inject the medication in his abdomen area. First dose was given to the patient during the teaching. Patient is confident to continue self injections at home.

## 2024-06-06 ENCOUNTER — Institutional Professional Consult (permissible substitution) (INDEPENDENT_AMBULATORY_CARE_PROVIDER_SITE_OTHER): Admitting: Internal Medicine

## 2024-06-15 ENCOUNTER — Ambulatory Visit: Admitting: Family Medicine

## 2024-06-21 ENCOUNTER — Ambulatory Visit: Admitting: Medical-Surgical

## 2024-06-21 ENCOUNTER — Encounter: Payer: Self-pay | Admitting: Medical-Surgical

## 2024-06-21 ENCOUNTER — Ambulatory Visit: Admitting: Family Medicine

## 2024-06-21 MED ORDER — ZEPBOUND 5 MG/0.5ML ~~LOC~~ SOAJ: 5.0000 mg | SUBCUTANEOUS | 0 refills | Status: DC

## 2024-06-21 NOTE — Progress Notes (Signed)
 Established patient visit   History of Present Illness   Discussed the use of AI scribe software for clinical note transcription with the patient, who gave verbal consent to proceed.  History of Present Illness   Benjamin Singleton is a 58 year old male who presents for a refill of his Zepbound  medication.  He has been taking Zepbound  2.5 mg weekly for one month and is due for a new dose this Thursday. He experiences anxiety, particularly when not exercising, but sleeps well and has no significant side effects. Initial nausea with the first doses has resolved. He has no constipation or diarrhea.  He is concerned about weight loss progress, noting a decrease in appetite initially, but increased appetite in the last week, possibly due to anxiety. He exercises regularly, focusing on swimming due to ankle issues that prevent high-impact activities. He has a history of bodybuilding but now does low-weight, high-repetition exercises to maintain muscle without worsening his ankle problems.  He does not consume junk food. He struggles with portion control, particularly with healthy foods like fruits, and is exploring different protein sources, mindful of their impact on digestion.      Physical Exam   Physical Exam Vitals and nursing note reviewed.  Constitutional:      General: He is not in acute distress.    Appearance: Normal appearance.  HENT:     Head: Normocephalic and atraumatic.     Right Ear: Tympanic membrane and external ear normal. Decreased hearing noted.     Left Ear: Tympanic membrane and external ear normal. Decreased hearing noted.     Ears:     Comments: Bilateral ear canal erythema with normal TMs. Cardiovascular:     Rate and Rhythm: Normal rate and regular rhythm.     Pulses: Normal pulses.     Heart sounds: Normal heart sounds. No murmur heard.    No friction rub. No gallop.  Pulmonary:     Effort: Pulmonary effort is normal. No respiratory distress.      Breath sounds: Normal breath sounds.  Skin:    General: Skin is warm and dry.  Neurological:     Mental Status: He is alert and oriented to person, place, and time.  Psychiatric:        Mood and Affect: Mood normal.        Behavior: Behavior normal.        Thought Content: Thought content normal.        Judgment: Judgment normal.     Assessment & Plan   Assessment and Plan    Obesity Obesity management with Zepbound  2.5 mg weekly showed minimal side effects. Initial nausea resolved. No significant weight loss yet, expected at this dose. - Increase Zepbound  to 5 mg weekly for the next four weeks, discussed potential side effects including nausea and GI upset. - Educated on portion control and mindful eating to aid weight loss. - Encouraged regular exercise, including swimming and strength training, to boost metabolism and support weight loss.  Neuropathy and pain of the ankle Chronic neuropathy and pain in the ankle, likely exacerbated by obesity. - Continue working on weight loss to aid in chronic pain.   Anxiety symptoms Intermittent anxiety, particularly when not exercising. Potential link between anxiety and eating habits. - Encourage finding alternative coping mechanisms to snacking when anxious.  Hearing loss Redness in both ear canals, possibly due to Q-tip use or allergies. - Advised against using Q-tips to prevent further irritation  or damage. - Consider referral to Audiology, discuss on follow up with PCP.      Follow up   Return in about 4 weeks (around 07/19/2024) for weight check on Zepbound .  __________________________________ Zada FREDRIK Palin, DNP, APRN, FNP-BC Primary Care and Sports Medicine [facility]

## 2024-07-05 ENCOUNTER — Other Ambulatory Visit: Payer: Self-pay | Admitting: Family Medicine

## 2024-07-05 ENCOUNTER — Telehealth: Payer: Self-pay

## 2024-07-05 MED ORDER — ZEPBOUND 5 MG/0.5ML ~~LOC~~ SOAJ: 5.0000 mg | SUBCUTANEOUS | 0 refills | Status: DC

## 2024-07-05 NOTE — Telephone Encounter (Signed)
 Copied from CRM [id number]. Topic: Clinical - Medication Refill >> Jul 05, 2024 11:08 AM Susanna ORN wrote: Medication: tirzepatide  (ZEPBOUND ) 5 MG/0.5ML Pen  Has the patient contacted their pharmacy? No (Agent: If no, request that the patient contact the pharmacy for the refill. If patient does not wish to contact the pharmacy document the reason why and proceed with request.) (Agent: If yes, when and what did the pharmacy advise?)  This is the patient's preferred pharmacy:  [id number] - [place], [place] - [address] AT [facility] [address] [place] [address] Phone: [phone] Fax: [phone]  Is this the correct pharmacy for this prescription? Yes If no, delete pharmacy and type the correct one.   Has the prescription been filled recently? Yes  Is the patient out of the medication? N/A  Has the patient been seen for an appointment in the last year OR does the patient have an upcoming appointment? Yes  Can we respond through MyChart? Yes  Agent: Please be advised that Rx refills may take up to 3 business days. We ask that you follow-up with your pharmacy.

## 2024-07-05 NOTE — Telephone Encounter (Signed)
 Spoke with patient. States his injection for zepbound  is due around 8/21 but he will be out of town in [place] during this time. He is wanting to increase strength at next refill to 7.5mg  for the next refill.  He states the strength increase may also require a prior authorization as well. He is not sure what can be done to help him get the refill before he leaves to go out of town. Or if could be sent to a [facility] in [place] instead?  He wanted to Know Dr. Alvia thoughts/ recommendations regarding this. The earliest he could be seen with Dr. Alvia  is Aug 26th and this would be too late

## 2024-07-05 NOTE — Telephone Encounter (Signed)
 Copied from CRM [id number]. Topic: Clinical - Medication Question >> Jul 05, 2024 11:09 AM Susanna ORN wrote: Reason for CRM: Patient has questions about his tirzepatide  (ZEPBOUND ) 5 MG/0.5ML Pen. He states he will be out of medication after the 21st. Advised patient that his prescription is for a 28 day supply. Not sure what he's asking but he would like for the nurse to give him a call. He tried to get a sooner appt for August 20th but Dr. Alvia doesn't have availability for that day. He's currently scheduled for August 26th. CB #: [phone]

## 2024-07-06 MED ORDER — ZEPBOUND 7.5 MG/0.5ML ~~LOC~~ SOAJ: 7.5000 mg | SUBCUTANEOUS | 3 refills | Status: DC

## 2024-07-06 NOTE — Telephone Encounter (Signed)
 Attempted call to patient. Left a voice mail message requesting a return call.

## 2024-07-07 ENCOUNTER — Telehealth: Payer: Self-pay

## 2024-07-07 NOTE — Telephone Encounter (Signed)
 Spoke with patient - states pharmacy informed him that Zepbound  7.5mg  is in need of prior authorization   Patient also wanting to know if visit schld for 07/18/2024 could be done as a virtual appointment as he will be in [place] at the time of this visit? If  not, what  DR. Alvia would recommend as far as scheduling ?

## 2024-07-07 NOTE — Telephone Encounter (Signed)
 Copied from CRM [id number]. Topic: General - Other >> Jul 07, 2024 12:27 PM Kevelyn M wrote: Reason for CRM: patient calling back for [facility]. Call back # [phone]

## 2024-07-07 NOTE — Telephone Encounter (Signed)
 Attempted a return call to patient. Left a voice mail message requesting a return call from  patient.

## 2024-07-10 ENCOUNTER — Other Ambulatory Visit (HOSPITAL_COMMUNITY): Payer: Self-pay

## 2024-07-10 ENCOUNTER — Telehealth: Payer: Self-pay

## 2024-07-10 NOTE — Telephone Encounter (Signed)
 Pharmacy Patient Advocate Encounter   Received notification from Pt Calls Messages that prior authorization for Zepbound  7.5mg /0.8ml is required/requested.   Insurance verification completed.   The patient is insured through [employer] .   Per test claim: PA required and submitted KEY/EOC/Request #: [id number] due to an active PA is already on file with expiration date of 79739773. Can be refilled on or after 07/14/2024

## 2024-07-11 ENCOUNTER — Telehealth: Payer: Self-pay | Admitting: Urology

## 2024-07-11 NOTE — Telephone Encounter (Signed)
 Last read by Kellie Leala Jubilee at 9:24AM on 07/11/2024.

## 2024-07-11 NOTE — Telephone Encounter (Signed)
 Patient called to inquire about a ultrasound that he is supposed to have. Based on [facility] notes he does want him to have on but there is no order. Can we please get this ordered he is scheduled on 08/02/24. Thank You.

## 2024-07-11 NOTE — Telephone Encounter (Signed)
 Spoke with DR. Alvia and appointment changed to Mychart video appt.

## 2024-07-12 ENCOUNTER — Other Ambulatory Visit: Payer: Self-pay | Admitting: Urology

## 2024-07-12 DIAGNOSIS — N210 Calculus in bladder: Secondary | ICD-10-CM

## 2024-07-18 ENCOUNTER — Encounter: Payer: Self-pay | Admitting: Family Medicine

## 2024-07-18 ENCOUNTER — Telehealth (INDEPENDENT_AMBULATORY_CARE_PROVIDER_SITE_OTHER): Admitting: Family Medicine

## 2024-07-18 DIAGNOSIS — Z6838 Body mass index (BMI) 38.0-38.9, adult: Secondary | ICD-10-CM

## 2024-07-18 NOTE — Progress Notes (Signed)
 Benjamin Singleton - 58 y.o. male MRN [id number]  Date of birth: [birth year]   All issues noted in this document were discussed and addressed.  No physical exam was performed (except for noted visual exam findings with Video Visits).  I discussed the limitations of evaluation and management by telemedicine and the availability of in person appointments. The patient expressed understanding and agreed to proceed.  I connected withNAME@ on 07/18/24 at  3:50 PM EDT by a video enabled telemedicine application and verified that I am speaking with the correct person using two identifiers.  Present at visit: Velma Ku, DO [name]   Patient Location: Home [address] [place] [address]   Provider location:   [facility]  Chief Complaint  Patient presents with   Weight Loss    HPI  Benjamin Singleton is a 58 y.o. male who presents via audio/video conferencing for a telehealth visit today.  Following up today for weight management.  Currently on Zepbound  is doing well at this.  Recently increased to 7.5 mg and is tolerating well.  Weight is down to 270 pounds. Body mass index is 38.74 kg/m. He is walking some for exercise.    ROS:  A comprehensive ROS was completed and negative except as noted per HPI  Past Medical History:  Diagnosis Date   Gastritis 06/14/2015   GERD (gastroesophageal reflux disease) 06/14/2015   Hypertension 05/24/2019   Sleep apnea     History reviewed. No pertinent surgical history.  Family History  Problem Relation Age of Onset   Hypertension Mother    Cancer Father        Prostate   Cancer Maternal Aunt        ovarian   Hypertension Other    Diabetes Other    Cancer Other     Social History   Socioeconomic History   Marital status: Married    Spouse name: Not on file   Number of children: Not on file   Years of education: Not on file   Highest education level: Bachelor's degree (e.g., BA, AB, BS)  Occupational History   Not on  file  Tobacco Use   Smoking status: Never   Smokeless tobacco: Never  Vaping Use   Vaping status: Never Used  Substance and Sexual Activity   Alcohol use: Yes    Comment: social   Drug use: No   Sexual activity: Yes  Other Topics Concern   Not on file  Social History Narrative   Not on file   Social Drivers of Health   Financial Resource Strain: Low Risk  (03/31/2023)   Overall Financial Resource Strain (CARDIA)    Difficulty of Paying Living Expenses: Not hard at all  Food Insecurity: No Food Insecurity (03/31/2023)   Hunger Vital Sign    Worried About Running Out of Food in the Last Year: Never true    Ran Out of Food in the Last Year: Never true  Transportation Needs: No Transportation Needs (03/31/2023)   PRAPARE - Administrator, Civil Service (Medical): No    Lack of Transportation (Non-Medical): No  Physical Activity: Sufficiently Active (03/31/2023)   Exercise Vital Sign    Days of Exercise per Week: 4 days    Minutes of Exercise per Session: 60 min  Stress: Stress Concern Present (03/31/2023)   [employer] of Occupational Health - Occupational Stress Questionnaire    Feeling of Stress : Rather much  Social Connections: Moderately Integrated (03/31/2023)  Social Advertising account executive    Frequency of Communication with Friends and Family: Twice a week    Frequency of Social Gatherings with Friends and Family: Once a week    Attends Religious Services: Never    Database administrator or Organizations: Yes    Attends Engineer, structural: More than 4 times per year    Marital Status: Married  Catering manager Violence: Unknown (02/24/2022)   Received from [facility]   HITS    Physically Hurt: Not on file    Insult or Talk Down To: Not on file    Threaten Physical Harm: Not on file    Scream or Curse: Not on file     Current Outpatient Medications:    cholecalciferol (VITAMIN D3) 25 MCG (1000 UNIT) tablet, Take 1,000 Units by mouth  daily., Disp: , Rfl:    diclofenac  (VOLTAREN ) 75 MG EC tablet, Take 1 tablet (75 mg total) by mouth 2 (two) times daily., Disp: 60 tablet, Rfl: 3   Homeopathic Products (ARNICARE ARNICA) CREA, Apply topically., Disp: , Rfl:    loratadine (CLARITIN) 10 MG tablet, Take 10 mg by mouth daily., Disp: , Rfl:    Multiple Vitamin (MULTIVITAMIN WITH MINERALS) TABS tablet, Take 1 tablet by mouth daily., Disp: , Rfl:    Potassium Citrate  15 MEQ (1620 MG) TBCR, 1 tablet p.o. 3 times daily, Disp: 90 tablet, Rfl: 11   tirzepatide  (ZEPBOUND ) 7.5 MG/0.5ML Pen, Inject 7.5 mg into the skin once a week., Disp: 2 mL, Rfl: 3   vitamin E 45 MG (100 UNITS) capsule, Take by mouth daily., Disp: , Rfl:   EXAM:  VITALS per patient if applicable: Ht 5' 10 (1.778 m)   Wt 270 lb (122.5 kg)   BMI 38.74 kg/m   GENERAL: alert, oriented, appears well and in no acute distress  HEENT: atraumatic, conjunttiva clear, no obvious abnormalities on inspection of external nose and ears  NECK: normal movements of the head and neck  LUNGS: on inspection no signs of respiratory distress, breathing rate appears normal, no obvious gross SOB, gasping or wheezing  CV: no obvious cyanosis  MS: moves all visible extremities without noticeable abnormality  PSYCH/NEURO: pleasant and cooperative, no obvious depression or anxiety, speech and thought processing grossly intact  ASSESSMENT AND PLAN:  Discussed the following assessment and plan:  Morbid obesity (HCC) Doing well with Zepbound  at current strength.  Will plan to continue at 7.5 mg.  Will plan to follow-up in about 2 to 3 months.  Can continue to titrate as warranted.  Encouraged continued dietary changes along with incorporation of additional exercise.     I discussed the assessment and treatment plan with the patient. The patient was provided an opportunity to ask questions and all were answered. The patient agreed with the plan and demonstrated an understanding of the  instructions.   The patient was advised to call back or seek an in-person evaluation if the symptoms worsen or if the condition fails to improve as anticipated.    Velma Ku, DO

## 2024-07-18 NOTE — Assessment & Plan Note (Addendum)
 Doing well with Zepbound  at current strength.  Will plan to continue at 7.5 mg.  Will plan to follow-up in about 2 to 3 months.  Can continue to titrate as warranted.  Encouraged continued dietary changes along with incorporation of additional exercise.

## 2024-07-25 ENCOUNTER — Encounter: Payer: Self-pay | Admitting: Sports Medicine

## 2024-07-27 ENCOUNTER — Institutional Professional Consult (permissible substitution) (INDEPENDENT_AMBULATORY_CARE_PROVIDER_SITE_OTHER): Admitting: Internal Medicine

## 2024-07-28 ENCOUNTER — Ambulatory Visit (HOSPITAL_BASED_OUTPATIENT_CLINIC_OR_DEPARTMENT_OTHER)
Admission: RE | Admit: 2024-07-28 | Discharge: 2024-07-28 | Disposition: A | Discharge status: Home / Self Care | Source: Ambulatory Visit | Attending: Urology | Admitting: Urology

## 2024-07-28 DIAGNOSIS — N210 Calculus in bladder: Secondary | ICD-10-CM | POA: Diagnosis present

## 2024-08-02 ENCOUNTER — Ambulatory Visit: Admitting: Urology

## 2024-08-08 ENCOUNTER — Ambulatory Visit: Payer: Self-pay | Admitting: Urology

## 2024-08-09 ENCOUNTER — Other Ambulatory Visit (HOSPITAL_BASED_OUTPATIENT_CLINIC_OR_DEPARTMENT_OTHER): Payer: Self-pay

## 2024-08-09 MED ORDER — COMIRNATY 30 MCG/0.3ML IM SUSY
0.3000 mL | PREFILLED_SYRINGE | Freq: Once | INTRAMUSCULAR | 0 refills | Status: AC
Start: 2024-08-09 — End: 2024-08-10
  Filled 2024-08-09: qty 0.3, 1d supply, fill #0

## 2024-08-09 MED ORDER — FLUZONE 0.5 ML IM SUSY
0.5000 mL | PREFILLED_SYRINGE | Freq: Once | INTRAMUSCULAR | 0 refills | Status: AC
  Filled 2024-08-09: qty 0.5, 1d supply, fill #0

## 2024-08-22 NOTE — Progress Notes (Signed)
 Assessment: -11 mm bladder calculus, stable in size, cystoscopy today confirmed this  -BPH with lower urinary tract symptoms.  Patient does not want medical therapy at this time.  He is on a natural supplement.  -History of hematuria, short term, more than likely related to his bladder calculus   Plan: -I discussed with the patient that I think he needs this bladder calculus treated with cystolitholapaxy  - I also discussed the fact that he does have a significantly large prostate and that if he is going to have an anesthetic procedure for cystolitholapaxy I would strongly recommend a HoLEP procedure.  I have discussed this with him as well as written down both of the above procedures  - He will go home and think about what he like to do.  He will get back with us  regarding his plan History of Present Illness:  8.2025: Initial visit  for evaluation of a bladder calculus.  He presented to his PCP on 19 May with left lower quadrant pain.  CT scan as follows:  CT scan from 04/18/2024: IMPRESSION: 1. An 11 mm stone within the urinary bladder may represent a bladder calculus or less likely a recently passed left renal stone. No hydronephrosis. 2. No bowel obstruction. Normal appendix. 3. Fatty liver.  He has been seen in [place] for a large prostate.  It was possibly recommended that he have a procedure done.  He did not want to do that.  He has been on a short course of what sounds like tamsulosin .  He stopped this due to side effects of him feeling funny.  Estimated prostate volume on CT scan 80 mL.  10.1.2025:Earlier this year he did have a episode of gross hematuria.  None since.  He was put on potassium citrate  with the hope that this was a uric acid stone and this could be treated with chemo lysis.  Recent bladder ultrasound revealed a persistent hyperechoic area consistent with stone.   Past Medical History:  Past Medical History:  Diagnosis Date   Gastritis 06/14/2015    GERD (gastroesophageal reflux disease) 06/14/2015   Hypertension 05/24/2019   Sleep apnea     Past Surgical History:  No past surgical history on file.  Allergies:  Allergies  Allergen Reactions   Penicillins Rash    Family History:  Family History  Problem Relation Age of Onset   Hypertension Mother    Cancer Father        Prostate   Cancer Maternal Aunt        ovarian   Hypertension Other    Diabetes Other    Cancer Other     Social History:  Social History   Tobacco Use   Smoking status: Never   Smokeless tobacco: Never  Vaping Use   Vaping status: Never Used  Substance Use Topics   Alcohol use: Yes    Comment: social   Drug use: No      Results: No results found for this or any previous visit (from the past 24 hours).  I have reviewed referring/prior physicians notes  I have reviewed urinalysis  I have reviewed PSA results--most recently 1.2 in March 2025  I have reviewed prior imaging--recent CT images reviewed.  Estimated prostate volume 80 mL.  Bladder calculus noted.  Hounsfield units of bladder stone 600.  Recent limited pelvic ultrasound findings-FINDINGS: There is a 1.8 x 1.7 x 0.8 cm hyperechoic focus which appears to be part of prostate protrusion into the bladder  base. Separate bladder calculus is not entirely excluded. This has increased in size when compared to prior CT. The bladder is otherwise within normal limits.   IMPRESSION: 1.8 x 1.7 x 0.8 cm hyperechoic focus/calcification which appears to be part of prostate protrusion into the bladder base. Separate bladder calculus is not entirely excluded.  Cystoscopy Procedure Note:  Indication: Bladder stone  After informed consent and discussion of the procedure and its risks, Benjamin Singleton was positioned and prepped in the standard fashion.  Cystoscopy was performed with a flexible cystoscope.   Findings: Urethra: No stricture or lesion Prostate: Obstruction with bilobar  hypertrophy with protrusion into the bladder Bladder neck: Open Ureteral orifices: Normal Bladder: No urothelial lesions.  There is a stone layering posteriorly in the bladder on the left.  Approximately 15 mm in size  The patient tolerated the procedure well.

## 2024-08-23 ENCOUNTER — Ambulatory Visit (INDEPENDENT_AMBULATORY_CARE_PROVIDER_SITE_OTHER): Admitting: Urology

## 2024-08-23 ENCOUNTER — Ambulatory Visit: Admitting: Podiatry

## 2024-08-23 VITALS — BP 155/94 | HR 87 | Ht 70.0 in | Wt 262.0 lb

## 2024-08-23 DIAGNOSIS — N401 Enlarged prostate with lower urinary tract symptoms: Secondary | ICD-10-CM

## 2024-08-23 DIAGNOSIS — M76821 Posterior tibial tendinitis, right leg: Secondary | ICD-10-CM

## 2024-08-23 DIAGNOSIS — Z87448 Personal history of other diseases of urinary system: Secondary | ICD-10-CM

## 2024-08-23 DIAGNOSIS — N210 Calculus in bladder: Secondary | ICD-10-CM | POA: Diagnosis not present

## 2024-08-23 LAB — MICROSCOPIC EXAMINATION: RBC, Urine: >30 /HPF — AB (ref 0–2)

## 2024-08-23 LAB — URINALYSIS, ROUTINE W REFLEX MICROSCOPIC
Bilirubin, UA: NEGATIVE
Glucose, UA: NEGATIVE
Leukocytes,UA: NEGATIVE
Nitrite, UA: NEGATIVE
Specific Gravity, UA: 1.02 (ref 1.005–1.030)
Urobilinogen, Ur: 0.2 mg/dL (ref 0.2–1.0)
pH, UA: 7 (ref 5.0–7.5)

## 2024-08-23 MED ORDER — CIPROFLOXACIN HCL 500 MG PO TABS
500.0000 mg | ORAL_TABLET | Freq: Once | ORAL | Status: AC
  Administered 2024-08-23: 500 mg via ORAL

## 2024-08-23 NOTE — Progress Notes (Unsigned)
  Subjective:  Patient ID: Benjamin Singleton, male    DOB: [birth year],  MRN: [id number]  Chief Complaint  Patient presents with   Foot Pain    Pt stated that he is here for a follow up on his right foot he stated that he was supposed to come in and get a second injection but he was not able to come in for it.     58 y.o. male presents with the above complaint.  Patient presents for follow-up of right posterior tibial tendinitis.  He states that he is doing good.  Denies any other acute complaints.    Review of Systems: Negative except as noted in the HPI. Denies N/V/F/Ch.  Past Medical History:  Diagnosis Date   Gastritis 06/14/2015   GERD (gastroesophageal reflux disease) 06/14/2015   Hypertension 05/24/2019   Sleep apnea     Current Outpatient Medications:    cholecalciferol (VITAMIN D3) 25 MCG (1000 UNIT) tablet, Take 1,000 Units by mouth daily., Disp: , Rfl:    diclofenac  (VOLTAREN ) 75 MG EC tablet, Take 1 tablet (75 mg total) by mouth 2 (two) times daily., Disp: 60 tablet, Rfl: 3   Homeopathic Products (ARNICARE ARNICA) CREA, Apply topically., Disp: , Rfl:    loratadine (CLARITIN) 10 MG tablet, Take 10 mg by mouth daily., Disp: , Rfl:    Multiple Vitamin (MULTIVITAMIN WITH MINERALS) TABS tablet, Take 1 tablet by mouth daily., Disp: , Rfl:    Potassium Citrate  15 MEQ (1620 MG) TBCR, 1 tablet p.o. 3 times daily, Disp: 90 tablet, Rfl: 11   tirzepatide  (ZEPBOUND ) 7.5 MG/0.5ML Pen, Inject 7.5 mg into the skin once a week., Disp: 2 mL, Rfl: 3   vitamin E 45 MG (100 UNITS) capsule, Take by mouth daily., Disp: , Rfl:   Social History   Tobacco Use  Smoking Status Never  Smokeless Tobacco Never    Allergies  Allergen Reactions   Penicillins Rash   Objective:  There were no vitals filed for this visit. There is no height or weight on file to calculate BMI. Constitutional Well developed. Well nourished.  Vascular Dorsalis pedis pulses palpable bilaterally. Posterior tibial  pulses palpable bilaterally. Capillary refill normal to all digits.  No cyanosis or clubbing noted. Pedal hair growth normal.  Neurologic Normal speech. Oriented to person, place, and time. Epicritic sensation to light touch grossly present bilaterally.  Dermatologic Nails well groomed and normal in appearance. No open wounds. No skin lesions.  Orthopedic: Pain along the course of the posterior tibial tendon pain at the insertion.  Pain with resisted plantar action inversion of the foot no pain with dorsiflexion eversion of the foot no pain at the peroneal tendon Achilles tendon ATFL ligament   Radiographs: None Assessment:   No diagnosis found.  Plan:  Patient was evaluated and treated and all questions answered.  Right posterior tibial tendinitis -All questions and concerns were discussed with the patient extensive detail.  -Clinically patient would like to retry PRP injection.  I discussed this with the patient he is scheduled to do PRP next week.  -Shoe gear modification discussed -  No follow-ups on file.

## 2024-08-25 ENCOUNTER — Ambulatory Visit: Admitting: Podiatry

## 2024-08-25 ENCOUNTER — Encounter: Payer: Self-pay | Admitting: Podiatry

## 2024-08-25 VITALS — BP 140/86 | HR 68 | Resp 18

## 2024-08-25 DIAGNOSIS — M76821 Posterior tibial tendinitis, right leg: Secondary | ICD-10-CM

## 2024-08-25 NOTE — Progress Notes (Unsigned)
 Post right PRP injection patient c/o severe pain with standing and walking. Patient unable to WB on right foot. Provider notified. Patient then complained of lightheadedness and dizziness. Patient requested alcohol wipe to hold under nose. Provided this to patient with provider in room. Vital signs obtained. Patient provided with water and coached on relaxation and deep breathing techniques. Vital sins obtained again. Patient attempted to stand and walk again but reported pain still present. Patient was seated in exam chair again. Instructed patient to stay seated and to wait in chair for a few more minutes. Provider aware that patient remained in room until feeling better. Prior to RN returning provider re-evaluated patient. Provider gave patient okay to leave. Patient stable upon leaving office. Denied lightheadedness, dizziness and decrease in pain.  Patient remained in room for total of approximately 1 hour.

## 2024-08-25 NOTE — Progress Notes (Signed)
 Right posterior tibial tendinitis PRP injection given 5 cc.  No complication noted.  The PRP injection was delivered in standard technique

## 2024-09-27 ENCOUNTER — Ambulatory Visit: Admitting: Podiatry

## 2024-09-27 DIAGNOSIS — M76821 Posterior tibial tendinitis, right leg: Secondary | ICD-10-CM

## 2024-09-27 NOTE — Progress Notes (Signed)
 5 cc of PRP injection was given in standard technique no complication noted.  Patient tolerated the injection well.  Reduction of pain noted from previous injection this is the second injection

## 2024-10-07 ENCOUNTER — Other Ambulatory Visit: Payer: Self-pay

## 2024-10-07 ENCOUNTER — Encounter: Payer: Self-pay | Admitting: Emergency Medicine

## 2024-10-07 ENCOUNTER — Ambulatory Visit
Admission: EM | Admit: 2024-10-07 | Discharge: 2024-10-07 | Disposition: A | Discharge status: Home / Self Care | Attending: Family Medicine | Admitting: Family Medicine

## 2024-10-07 DIAGNOSIS — H6691 Otitis media, unspecified, right ear: Secondary | ICD-10-CM | POA: Diagnosis not present

## 2024-10-07 MED ORDER — CEFDINIR 300 MG PO CAPS: 300.0000 mg | ORAL_CAPSULE | Freq: Two times a day (BID) | ORAL | 0 refills | Status: DC

## 2024-10-07 NOTE — ED Provider Notes (Signed)
 [facility]    CSN: [id number] Arrival date & time: 10/07/24  9182      History   Chief Complaint Chief Complaint  Patient presents with   Ear Fullness    HPI Benjamin Singleton is a 58 y.o. male.   HPI 58 year old male presents with ear pressure fullness, and buzzing of the right ear for 3 days.  PMH significant for morbid obesity, HTN, and sleep apnea.  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 No pertinent surgical history.     Home Medications    Prior to Admission medications   Medication Sig Start Date End Date Taking? Authorizing Provider  cefdinir  (OMNICEF ) 300 MG capsule Take 1 capsule (300 mg total) by mouth 2 (two) times daily for 10 days. 10/07/24 10/17/24 Yes Teddy Sharper, FNP  cholecalciferol (VITAMIN D3) 25 MCG (1000 UNIT) tablet Take 1,000 Units by mouth daily.    [provider]  diclofenac  (VOLTAREN ) 75 MG EC tablet Take 1 tablet (75 mg total) by mouth 2 (two) times daily. 05/02/24 05/02/25  Curtis Debby PARAS, MD  Homeopathic Products (ARNICARE ARNICA) CREA Apply topically.    [provider]  loratadine (CLARITIN) 10 MG tablet Take 10 mg by mouth daily.    [provider]  Multiple Vitamin (MULTIVITAMIN WITH  MINERALS) TABS tablet Take 1 tablet by mouth daily.    [provider]  Potassium Citrate  15 MEQ (1620 MG) TBCR 1 tablet p.o. 3 times daily 05/03/24   Matilda Senior, MD  tirzepatide  (ZEPBOUND ) 7.5 MG/0.5ML Pen Inject 7.5 mg into the skin once a week. 07/06/24   Alvia Bring, DO  vitamin E 45 MG (100 UNITS) capsule Take by mouth daily.    [provider]    Family History Family History  Problem Relation Age of Onset   Hypertension Mother    Cancer Father        Prostate   Cancer Maternal Aunt        ovarian   Hypertension Other    Diabetes Other    Cancer Other     Social History Social History   Tobacco Use   Smoking status: Never   Smokeless tobacco: Never  Vaping Use   Vaping status: Never Used  Substance Use Topics   Alcohol use: Yes    Comment: social   Drug use: No     Allergies   Penicillins   Review of Systems Review of Systems   Physical Exam Triage Vital Signs ED Triage Vitals  Encounter Vitals Group     BP      Girls Systolic BP Percentile      Girls Diastolic BP Percentile      Boys Systolic BP Percentile      Boys Diastolic BP Percentile  Pulse      Resp      Temp      Temp src      SpO2      Weight      Height      Head Circumference      Peak Flow      Pain Score      Pain Loc      Pain Education      Exclude from Growth Chart    No data found.  Updated Vital Signs BP 135/78 (BP Location: Right Arm)   Pulse 71   Temp 97.9 F (36.6 C) (Oral)   SpO2 97%   Visual Acuity Right Eye Distance:   Left Eye Distance:   Bilateral Distance:    Right Eye Near:   Left Eye Near:    Bilateral Near:     Physical Exam Vitals and nursing note reviewed.  Constitutional:      Appearance: Normal appearance. He is obese.  HENT:     Head: Normocephalic and atraumatic.     Right Ear: Ear canal and external ear normal.     Left Ear: Tympanic membrane, ear canal and external ear normal.     Ears:     Comments:  Right TM: Red rimmed, erythematous    Nose: Nose normal.     Mouth/Throat:     Mouth: Mucous membranes are moist.     Pharynx: Oropharynx is clear.  Eyes:     Extraocular Movements: Extraocular movements intact.     Pupils: Pupils are equal, round, and reactive to light.  Cardiovascular:     Rate and Rhythm: Normal rate and regular rhythm.     Heart sounds: Normal heart sounds.  Pulmonary:     Effort: Pulmonary effort is normal.     Breath sounds: Normal breath sounds. No wheezing, rhonchi or rales.  Musculoskeletal:        General: Normal range of motion.  Skin:    General: Skin is warm and dry.  Neurological:     General: No focal deficit present.     Mental Status: He is alert and oriented to person, place, and time. Mental status is at baseline.  Psychiatric:        Mood and Affect: Mood normal.        Behavior: Behavior normal.      UC Treatments / Results  Labs (all labs ordered are listed, but only abnormal results are displayed) Labs Reviewed - No data to display  EKG   Radiology No results found.  Procedures Procedures (including critical care time)  Medications Ordered in UC Medications - No data to display  Initial Impression / Assessment and Plan / UC Course  I have reviewed the triage vital signs and the nursing notes.  Pertinent labs & imaging results that were available during my care of the patient were reviewed by me and considered in my medical decision making (see chart for details).     MDM: 1.  Acute right otitis media-Rx'd cefdinir  300 mg capsule: Take 1 capsule twice daily x 10 days. Advised patient take medication as directed with food to completion.  Encouraged increase daily water intake to 64 ounces per day while taking this medication.  Advised if symptoms worsen and/or unresolved please follow-up with your PCP, [facility], or here for further evaluation.  Contact information provided with his AVS today.  Patient discharged home,  hemodynamically stable. Final Clinical Impressions(s) / UC Diagnoses  Final diagnoses:  Acute right otitis media     Discharge Instructions      Advised patient take medication as directed with food to completion.  Encouraged increase daily water intake to 64 ounces per day while taking this medication.  Advised if symptoms worsen and/or unresolved please follow-up with your PCP, [facility], or here for further evaluation.  Contact information provided with his AVS today.     ED Prescriptions     Medication Sig Dispense Auth. Provider   cefdinir  (OMNICEF ) 300 MG capsule Take 1 capsule (300 mg total) by mouth 2 (two) times daily for 10 days. 20 capsule Thecla Forgione, FNP      PDMP not reviewed this encounter.   Teddy Sharper, FNP 10/07/24 0930

## 2024-10-07 NOTE — ED Triage Notes (Signed)
 Pt c/o right ear pressure and fullness for 3 days. States he can hear a buzzing sound and feels more pressure at night. States last night that side of his face and throat were hurting. He tried OTC drops with no relief.

## 2024-10-07 NOTE — Discharge Instructions (Addendum)
 Advised patient take medication as directed with food to completion.  Encouraged increase daily water intake to 64 ounces per day while taking this medication.  Advised if symptoms worsen and/or unresolved please follow-up with your PCP, [facility], or here for further evaluation.  Contact information provided with his AVS today.

## 2024-10-10 ENCOUNTER — Ambulatory Visit (INDEPENDENT_AMBULATORY_CARE_PROVIDER_SITE_OTHER): Admitting: Family Medicine

## 2024-10-10 ENCOUNTER — Encounter: Payer: Self-pay | Admitting: Family Medicine

## 2024-10-10 VITALS — BP 134/79 | HR 69 | Ht 70.0 in | Wt 257.0 lb

## 2024-10-10 DIAGNOSIS — N401 Enlarged prostate with lower urinary tract symptoms: Secondary | ICD-10-CM | POA: Diagnosis not present

## 2024-10-10 DIAGNOSIS — R350 Frequency of micturition: Secondary | ICD-10-CM

## 2024-10-10 DIAGNOSIS — R7303 Prediabetes: Secondary | ICD-10-CM | POA: Diagnosis not present

## 2024-10-10 LAB — POCT GLYCOSYLATED HEMOGLOBIN (HGB A1C): HbA1c, POC (prediabetic range): 5.4 % — AB (ref 5.7–6.4)

## 2024-10-10 LAB — POCT URINALYSIS DIP (CLINITEK)
Bilirubin, UA: NEGATIVE
Blood, UA: NEGATIVE
Glucose, UA: NEGATIVE mg/dL
Ketones, POC UA: NEGATIVE mg/dL
Leukocytes, UA: NEGATIVE
Nitrite, UA: NEGATIVE
POC PROTEIN,UA: NEGATIVE
Spec Grav, UA: 1.02 (ref 1.010–1.025)
Urobilinogen, UA: 0.2 U/dL
pH, UA: 7 (ref 5.0–8.0)

## 2024-10-10 MED ORDER — ALFUZOSIN HCL ER 10 MG PO TB24: 10.0000 mg | ORAL_TABLET | Freq: Every day | ORAL | 0 refills | Status: AC

## 2024-10-10 MED ORDER — ZEPBOUND 10 MG/0.5ML ~~LOC~~ SOAJ: 10.0000 mg | SUBCUTANEOUS | 0 refills | Status: AC

## 2024-10-10 NOTE — Progress Notes (Signed)
 Benjamin Singleton - 58 y.o. male MRN [id number]  Date of birth: [birth year]  Subjective Chief Complaint  Patient presents with   Urinary Frequency   Weight Loss    HPI Benjamin Singleton is a 58 y.o. male here today for follow up visit.   He reports that he is doing well.   He has been using zepbound  to help with weight management.  He has done pretty well with this.  Weight is down a few pounds.  He would like to try increasing strength.     He is also having some urinary frequency.  Denies abdominal pain or pain with urination.  Blood sugars are well controlled as his A1c is 5.4%.  He has seen urology and has bladder clculus as well as enlarged prostate.  Urology recommended cystolitholapaxy and HoLEP for BPH.   He is planning on doing this at the beginning of the year.  He did try tamsulosin  but had side effect of hair loss.   ROS:  A comprehensive ROS was completed and negative except as noted per HPI  Allergies  Allergen Reactions   Penicillins Rash    Past Medical History:  Diagnosis Date   Gastritis 06/14/2015   GERD (gastroesophageal reflux disease) 06/14/2015   Hypertension 05/24/2019   Sleep apnea     History reviewed. No pertinent surgical history.  Social History   Socioeconomic History   Marital status: Married    Spouse name: Not on file   Number of children: Not on file   Years of education: Not on file   Highest education level: Bachelor's degree (e.g., BA, AB, BS)  Occupational History   Not on file  Tobacco Use   Smoking status: Never   Smokeless tobacco: Never  Vaping Use   Vaping status: Never Used  Substance and Sexual Activity   Alcohol use: Yes    Comment: social   Drug use: No   Sexual activity: Yes  Other Topics Concern   Not on file  Social History Narrative   Not on file   Social Drivers of Health   Financial Resource Strain: Low Risk  (03/31/2023)   Overall Financial Resource Strain (CARDIA)    Difficulty of Paying Living  Expenses: Not hard at all  Food Insecurity: No Food Insecurity (03/31/2023)   Hunger Vital Sign    Worried About Running Out of Food in the Last Year: Never true    Ran Out of Food in the Last Year: Never true  Transportation Needs: No Transportation Needs (03/31/2023)   PRAPARE - Administrator, Civil Service (Medical): No    Lack of Transportation (Non-Medical): No  Physical Activity: Sufficiently Active (03/31/2023)   Exercise Vital Sign    Days of Exercise per Week: 4 days    Minutes of Exercise per Session: 60 min  Stress: Stress Concern Present (03/31/2023)   [employer] of Occupational Health - Occupational Stress Questionnaire    Feeling of Stress : Rather much  Social Connections: Moderately Integrated (03/31/2023)   Social Connection and Isolation Panel    Frequency of Communication with Friends and Family: Twice a week    Frequency of Social Gatherings with Friends and Family: Once a week    Attends Religious Services: Never    Database Administrator or Organizations: Yes    Attends Engineer, Structural: More than 4 times per year    Marital Status: Married    Family History  Problem Relation Age of  Onset   Hypertension Mother    Cancer Father        Prostate   Cancer Maternal Aunt        ovarian   Hypertension Other    Diabetes Other    Cancer Other     Health Maintenance  Topic Date Due   HIV Screening  Never done   Hepatitis C Screening  Never done   Colonoscopy  Never done   Zoster Vaccines- Shingrix (1 of 2) 01/10/2025 (Originally 11/14/1985)   Hepatitis B Vaccines 19-59 Average Risk (1 of 3 - 19+ 3-dose series) 10/10/2025 (Originally 11/14/1985)   COVID-19 Vaccine (9 - Pfizer risk 2025-26 season) 02/06/2025   DTaP/Tdap/Td (2 - Td or Tdap) 10/31/2028   Pneumococcal Vaccine: 50+ Years  Completed   Influenza Vaccine  Completed   HPV VACCINES  Aged Out   Meningococcal B Vaccine  Aged Out      ----------------------------------------------------------------------------------------------------------------------------------------------------------------------------------------------------------------- Physical Exam BP 134/79 (BP Location: Left Arm, Patient Position: Sitting, Cuff Size: Large)   Pulse 69   Ht 5' 10 (1.778 m)   Wt 257 lb (116.6 kg)   SpO2 96%   BMI 36.88 kg/m   Physical Exam Constitutional:      Appearance: Normal appearance.  HENT:     Head: Normocephalic and atraumatic.  Eyes:     General: No scleral icterus. Cardiovascular:     Rate and Rhythm: Normal rate and regular rhythm.  Pulmonary:     Effort: Pulmonary effort is normal.     Breath sounds: Normal breath sounds.  Neurological:     Mental Status: He is alert.  Psychiatric:        Mood and Affect: Mood normal.        Behavior: Behavior normal.     ------------------------------------------------------------------------------------------------------------------------------------------------------------------------------------------------------------------- Assessment and Plan  Urinary frequency We discussed limiting beverages before bedtime.  Did not tolerate tamsulosin  due to hair loss.  Will try alfuzosin . Urinalysis is unremarkable. He has seen urology and has bladder stone and BPH.  Recommend f/u for treatment of these.   Morbid obesity (HCC) Doing well with zepbound  so far. Increase to 10mg  strength. F/u in 3 months.    Meds ordered this encounter  Medications   alfuzosin (UROXATRAL) 10 MG 24 hr tablet    Sig: Take 1 tablet (10 mg total) by mouth daily with breakfast.    Dispense:  90 tablet    Refill:  0   tirzepatide  (ZEPBOUND ) 10 MG/0.5ML Pen    Sig: Inject 10 mg into the skin once a week.    Dispense:  6 mL    Refill:  0    Return for weight management.

## 2024-10-10 NOTE — Assessment & Plan Note (Signed)
 We discussed limiting beverages before bedtime.  Did not tolerate tamsulosin  due to hair loss.  Will try alfuzosin . Urinalysis is unremarkable. He has seen urology and has bladder stone and BPH.  Recommend f/u for treatment of these.

## 2024-10-10 NOTE — Assessment & Plan Note (Signed)
 Doing well with zepbound  so far. Increase to 10mg  strength. F/u in 3 months.

## 2024-10-11 LAB — PSA: Prostate Specific Ag, Serum: 1.6 ng/mL (ref 0.0–4.0)

## 2024-10-12 ENCOUNTER — Ambulatory Visit: Payer: Self-pay | Admitting: Family Medicine

## 2024-10-27 ENCOUNTER — Other Ambulatory Visit: Payer: Self-pay

## 2024-10-27 ENCOUNTER — Ambulatory Visit: Admitting: Podiatry
